# Patient Record
Sex: Male | Born: 1950 | Race: White | Hispanic: No | Marital: Married | Smoking: Never smoker
Health system: Southern US, Community
[De-identification: ages and names within clinical notes are randomized; demographics above are authoritative.]

## PROBLEM LIST (undated history)

## (undated) DIAGNOSIS — M5020 Other cervical disc displacement, unspecified cervical region: Secondary | ICD-10-CM

## (undated) DIAGNOSIS — R269 Unspecified abnormalities of gait and mobility: Secondary | ICD-10-CM

## (undated) DIAGNOSIS — F32A Depression, unspecified: Secondary | ICD-10-CM

## (undated) DIAGNOSIS — M5126 Other intervertebral disc displacement, lumbar region: Secondary | ICD-10-CM

## (undated) DIAGNOSIS — R519 Headache, unspecified: Secondary | ICD-10-CM

## (undated) DIAGNOSIS — E162 Hypoglycemia, unspecified: Secondary | ICD-10-CM

## (undated) DIAGNOSIS — Z87442 Personal history of urinary calculi: Secondary | ICD-10-CM

## (undated) DIAGNOSIS — L309 Dermatitis, unspecified: Secondary | ICD-10-CM

## (undated) DIAGNOSIS — M1990 Unspecified osteoarthritis, unspecified site: Secondary | ICD-10-CM

## (undated) DIAGNOSIS — T148XXA Other injury of unspecified body region, initial encounter: Secondary | ICD-10-CM

## (undated) DIAGNOSIS — K219 Gastro-esophageal reflux disease without esophagitis: Secondary | ICD-10-CM

## (undated) DIAGNOSIS — F329 Major depressive disorder, single episode, unspecified: Secondary | ICD-10-CM

## (undated) DIAGNOSIS — E7800 Pure hypercholesterolemia, unspecified: Secondary | ICD-10-CM

## (undated) HISTORY — DX: Pure hypercholesterolemia, unspecified: E78.00

## (undated) HISTORY — DX: Dermatitis, unspecified: L30.9

## (undated) HISTORY — DX: Unspecified abnormalities of gait and mobility: R26.9

## (undated) HISTORY — PX: EXCISION NEUROMA: SHX6350

## (undated) HISTORY — PX: OTHER SURGICAL HISTORY: SHX169

## (undated) HISTORY — DX: Headache, unspecified: R51.9

## (undated) HISTORY — PX: CATARACT EXTRACTION: SUR2

## (undated) HISTORY — DX: Other injury of unspecified body region, initial encounter: T14.8XXA

## (undated) HISTORY — DX: Hypoglycemia, unspecified: E16.2

---
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
## 2002-05-05 ENCOUNTER — Ambulatory Visit (HOSPITAL_COMMUNITY): Admission: RE | Admit: 2002-05-05 | Discharge: 2002-05-05 | Payer: Self-pay | Admitting: Emergency Medicine

## 2002-05-05 ENCOUNTER — Encounter: Payer: Self-pay | Admitting: Emergency Medicine

## 2003-08-24 ENCOUNTER — Ambulatory Visit (HOSPITAL_COMMUNITY): Admission: RE | Admit: 2003-08-24 | Discharge: 2003-08-24 | Payer: Self-pay | Admitting: Neurosurgery

## 2003-08-24 ENCOUNTER — Encounter: Payer: Self-pay | Admitting: Neurosurgery

## 2010-12-03 ENCOUNTER — Encounter: Payer: Self-pay | Admitting: Neurosurgery

## 2011-08-13 DIAGNOSIS — Z87442 Personal history of urinary calculi: Secondary | ICD-10-CM

## 2011-08-13 HISTORY — DX: Personal history of urinary calculi: Z87.442

## 2013-05-22 ENCOUNTER — Other Ambulatory Visit: Payer: Self-pay | Admitting: Urology

## 2013-05-22 DIAGNOSIS — N200 Calculus of kidney: Secondary | ICD-10-CM

## 2013-05-23 ENCOUNTER — Other Ambulatory Visit: Payer: Self-pay | Admitting: Radiology

## 2013-07-09 ENCOUNTER — Encounter (HOSPITAL_COMMUNITY): Payer: Self-pay | Admitting: Pharmacy Technician

## 2013-07-10 ENCOUNTER — Encounter (HOSPITAL_COMMUNITY): Payer: Self-pay | Admitting: *Deleted

## 2013-07-21 ENCOUNTER — Other Ambulatory Visit: Payer: Self-pay | Admitting: Radiology

## 2013-07-21 MED ORDER — SODIUM CHLORIDE 0.9 % IV SOLN
2.0000 g | INTRAVENOUS | Status: AC
  Administered 2013-07-22: 2 g via INTRAVENOUS
  Filled 2013-07-21: qty 2000

## 2013-07-22 ENCOUNTER — Encounter (HOSPITAL_COMMUNITY): Payer: Self-pay | Admitting: Anesthesiology

## 2013-07-22 ENCOUNTER — Encounter (HOSPITAL_COMMUNITY): Payer: Self-pay

## 2013-07-22 ENCOUNTER — Ambulatory Visit (HOSPITAL_COMMUNITY): Payer: BC Managed Care – PPO

## 2013-07-22 ENCOUNTER — Ambulatory Visit (HOSPITAL_COMMUNITY)
Admission: RE | Admit: 2013-07-22 | Discharge: 2013-07-22 | Disposition: A | Discharge status: Home / Self Care | Payer: BC Managed Care – PPO | Source: Ambulatory Visit | Attending: Urology | Admitting: Urology

## 2013-07-22 ENCOUNTER — Ambulatory Visit (HOSPITAL_COMMUNITY): Payer: BC Managed Care – PPO | Admitting: Anesthesiology

## 2013-07-22 ENCOUNTER — Ambulatory Visit (HOSPITAL_COMMUNITY)
Admission: RE | Admit: 2013-07-22 | Discharge: 2013-07-23 | Disposition: A | Discharge status: Home / Self Care | Payer: BC Managed Care – PPO | Source: Ambulatory Visit | Attending: Urology | Admitting: Urology

## 2013-07-22 ENCOUNTER — Other Ambulatory Visit: Payer: Self-pay | Admitting: Urology

## 2013-07-22 ENCOUNTER — Encounter (HOSPITAL_COMMUNITY)
Admission: RE | Disposition: A | Discharge status: Home / Self Care | Payer: Self-pay | Source: Ambulatory Visit | Attending: Urology

## 2013-07-22 DIAGNOSIS — N200 Calculus of kidney: Secondary | ICD-10-CM

## 2013-07-22 DIAGNOSIS — K219 Gastro-esophageal reflux disease without esophagitis: Secondary | ICD-10-CM | POA: Insufficient documentation

## 2013-07-22 DIAGNOSIS — N135 Crossing vessel and stricture of ureter without hydronephrosis: Secondary | ICD-10-CM | POA: Insufficient documentation

## 2013-07-22 HISTORY — DX: Major depressive disorder, single episode, unspecified: F32.9

## 2013-07-22 HISTORY — DX: Other cervical disc displacement, unspecified cervical region: M50.20

## 2013-07-22 HISTORY — DX: Other intervertebral disc displacement, lumbar region: M51.26

## 2013-07-22 HISTORY — DX: Personal history of urinary calculi: Z87.442

## 2013-07-22 HISTORY — DX: Gastro-esophageal reflux disease without esophagitis: K21.9

## 2013-07-22 HISTORY — DX: Unspecified osteoarthritis, unspecified site: M19.90

## 2013-07-22 HISTORY — DX: Depression, unspecified: F32.A

## 2013-07-22 HISTORY — PX: NEPHROLITHOTOMY: SHX5134

## 2013-07-22 LAB — CBC WITH DIFFERENTIAL/PLATELET
Basophils Absolute: 0 10*3/uL (ref 0.0–0.1)
Basophils Relative: 1 % (ref 0–1)
Eosinophils Absolute: 0.3 10*3/uL (ref 0.0–0.7)
Eosinophils Relative: 4 % (ref 0–5)
HCT: 43.6 % (ref 39.0–52.0)
Hemoglobin: 15.2 g/dL (ref 13.0–17.0)
Lymphocytes Relative: 43 % (ref 12–46)
Lymphs Abs: 3 10*3/uL (ref 0.7–4.0)
MCH: 32.3 pg (ref 26.0–34.0)
MCHC: 34.9 g/dL (ref 30.0–36.0)
MCV: 92.6 fL (ref 78.0–100.0)
Monocytes Absolute: 0.7 10*3/uL (ref 0.1–1.0)
Monocytes Relative: 10 % (ref 3–12)
Neutro Abs: 2.8 10*3/uL (ref 1.7–7.7)
Neutrophils Relative %: 42 % — ABNORMAL LOW (ref 43–77)
Platelets: 213 10*3/uL (ref 150–400)
RBC: 4.71 MIL/uL (ref 4.22–5.81)
RDW: 12.2 % (ref 11.5–15.5)
WBC: 6.8 10*3/uL (ref 4.0–10.5)

## 2013-07-22 LAB — BASIC METABOLIC PANEL
BUN: 21 mg/dL (ref 6–23)
CO2: 30 mEq/L (ref 19–32)
Calcium: 9.3 mg/dL (ref 8.4–10.5)
Chloride: 104 mEq/L (ref 96–112)
Creatinine, Ser: 0.84 mg/dL (ref 0.50–1.35)
GFR calc Af Amer: >90 mL/min (ref >90)
GFR calc non Af Amer: >90 mL/min (ref >90)
Glucose, Bld: 88 mg/dL (ref 70–99)
Potassium: 3.8 mEq/L (ref 3.5–5.1)
Sodium: 138 mEq/L (ref 135–145)

## 2013-07-22 LAB — ABO/RH: ABO/RH(D): O POS

## 2013-07-22 LAB — TYPE AND SCREEN
ABO/RH(D): O POS
Antibody Screen: NEGATIVE

## 2013-07-22 LAB — APTT: aPTT: 25 seconds (ref 24–37)

## 2013-07-22 SURGERY — NEPHROLITHOTOMY PERCUTANEOUS
Anesthesia: General | Laterality: Left | Wound class: Clean

## 2013-07-22 MED ORDER — CIPROFLOXACIN IN D5W 400 MG/200ML IV SOLN
INTRAVENOUS | Status: AC
  Filled 2013-07-22: qty 200

## 2013-07-22 MED ORDER — DEXAMETHASONE SODIUM PHOSPHATE 10 MG/ML IJ SOLN
INTRAMUSCULAR | Status: DC | PRN
  Administered 2013-07-22: 10 mg via INTRAVENOUS

## 2013-07-22 MED ORDER — MIDAZOLAM HCL 2 MG/2ML IJ SOLN
INTRAMUSCULAR | Status: AC
  Filled 2013-07-22: qty 4

## 2013-07-22 MED ORDER — PROPOFOL 10 MG/ML IV BOLUS
INTRAVENOUS | Status: DC | PRN
  Administered 2013-07-22: 20 mg via INTRAVENOUS
  Administered 2013-07-22: 10 mg via INTRAVENOUS
  Administered 2013-07-22: 160 mg via INTRAVENOUS

## 2013-07-22 MED ORDER — GENTAMICIN SULFATE 40 MG/ML IJ SOLN
1.5000 mg/kg | INTRAVENOUS | Status: AC
  Administered 2013-07-22: 120 mg via INTRAVENOUS
  Filled 2013-07-22: qty 2.94

## 2013-07-22 MED ORDER — GLYCOPYRROLATE 0.2 MG/ML IJ SOLN
INTRAMUSCULAR | Status: DC | PRN
  Administered 2013-07-22: 0.4 mg via INTRAVENOUS
  Administered 2013-07-22: 0.2 mg via INTRAVENOUS

## 2013-07-22 MED ORDER — SODIUM CHLORIDE 0.9 % IV SOLN
INTRAVENOUS | Status: DC
  Administered 2013-07-22: 08:00:00 via INTRAVENOUS

## 2013-07-22 MED ORDER — LIDOCAINE HCL 1 % IJ SOLN
INTRAMUSCULAR | Status: AC
  Filled 2013-07-22: qty 20

## 2013-07-22 MED ORDER — EPHEDRINE SULFATE 50 MG/ML IJ SOLN
INTRAMUSCULAR | Status: DC | PRN
  Administered 2013-07-22: 10 mg via INTRAVENOUS

## 2013-07-22 MED ORDER — SODIUM CHLORIDE 0.9 % IR SOLN
Status: DC | PRN
  Administered 2013-07-22: 15000 mL

## 2013-07-22 MED ORDER — MORPHINE SULFATE 2 MG/ML IJ SOLN
INTRAMUSCULAR | Status: AC
  Filled 2013-07-22: qty 1

## 2013-07-22 MED ORDER — HYDROMORPHONE HCL PF 1 MG/ML IJ SOLN
INTRAMUSCULAR | Status: DC | PRN
  Administered 2013-07-22 (×4): 0.5 mg via INTRAVENOUS

## 2013-07-22 MED ORDER — SENNOSIDES-DOCUSATE SODIUM 8.6-50 MG PO TABS
1.0000 | ORAL_TABLET | Freq: Two times a day (BID) | ORAL | Status: DC
  Administered 2013-07-22 – 2013-07-23 (×2): 1 via ORAL
  Filled 2013-07-22 (×4): qty 1

## 2013-07-22 MED ORDER — LIDOCAINE HCL (CARDIAC) 20 MG/ML IV SOLN
INTRAVENOUS | Status: DC | PRN
  Administered 2013-07-22: 30 mg via INTRAVENOUS

## 2013-07-22 MED ORDER — MIDAZOLAM HCL 5 MG/5ML IJ SOLN
INTRAMUSCULAR | Status: DC | PRN
  Administered 2013-07-22: 1 mg via INTRAVENOUS

## 2013-07-22 MED ORDER — NEOSTIGMINE METHYLSULFATE 1 MG/ML IJ SOLN
INTRAMUSCULAR | Status: DC | PRN
  Administered 2013-07-22: 3 mg via INTRAVENOUS

## 2013-07-22 MED ORDER — EPHEDRINE SULFATE 50 MG/ML IJ SOLN: INTRAMUSCULAR | Status: DC | PRN

## 2013-07-22 MED ORDER — FLUTICASONE PROPIONATE 50 MCG/ACT NA SUSP
1.0000 | Freq: Every day | NASAL | Status: DC
  Administered 2013-07-23: 1 via NASAL
  Filled 2013-07-22: qty 16

## 2013-07-22 MED ORDER — IOHEXOL 300 MG/ML  SOLN
INTRAMUSCULAR | Status: DC | PRN
  Administered 2013-07-22: 32 mL

## 2013-07-22 MED ORDER — MORPHINE SULFATE 2 MG/ML IJ SOLN
2.0000 mg | INTRAMUSCULAR | Status: DC | PRN
  Administered 2013-07-22 (×2): 2 mg via INTRAVENOUS
  Filled 2013-07-22: qty 2

## 2013-07-22 MED ORDER — FENTANYL CITRATE 0.05 MG/ML IJ SOLN
INTRAMUSCULAR | Status: AC
  Filled 2013-07-22: qty 4

## 2013-07-22 MED ORDER — LACTATED RINGERS IV SOLN
INTRAVENOUS | Status: DC | PRN
  Administered 2013-07-22 (×2): via INTRAVENOUS

## 2013-07-22 MED ORDER — CIPROFLOXACIN IN D5W 400 MG/200ML IV SOLN: 400.0000 mg | Freq: Once | INTRAVENOUS | Status: DC

## 2013-07-22 MED ORDER — ONDANSETRON HCL 4 MG/2ML IJ SOLN
INTRAMUSCULAR | Status: DC | PRN
  Administered 2013-07-22 (×2): 2 mg via INTRAVENOUS

## 2013-07-22 MED ORDER — OXYCODONE HCL 5 MG PO TABS
5.0000 mg | ORAL_TABLET | ORAL | Status: DC | PRN
  Administered 2013-07-23: 10 mg via ORAL
  Filled 2013-07-22: qty 2

## 2013-07-22 MED ORDER — 0.9 % SODIUM CHLORIDE (POUR BTL) OPTIME
TOPICAL | Status: DC | PRN
  Administered 2013-07-22: 1000 mL

## 2013-07-22 MED ORDER — OXYBUTYNIN CHLORIDE 5 MG PO TABS
5.0000 mg | ORAL_TABLET | Freq: Three times a day (TID) | ORAL | Status: DC | PRN
  Filled 2013-07-22: qty 1

## 2013-07-22 MED ORDER — ONDANSETRON HCL 4 MG/2ML IJ SOLN
4.0000 mg | INTRAMUSCULAR | Status: DC | PRN
  Administered 2013-07-22: 4 mg via INTRAVENOUS
  Filled 2013-07-22: qty 2

## 2013-07-22 MED ORDER — CISATRACURIUM BESYLATE (PF) 10 MG/5ML IV SOLN
INTRAVENOUS | Status: DC | PRN
  Administered 2013-07-22: 3 mg via INTRAVENOUS
  Administered 2013-07-22: 4 mg via INTRAVENOUS

## 2013-07-22 MED ORDER — GENTAMICIN SULFATE 40 MG/ML IJ SOLN
5.0000 mg/kg | Freq: Once | INTRAVENOUS | Status: AC
  Administered 2013-07-22: 390 mg via INTRAVENOUS
  Filled 2013-07-22: qty 9.81

## 2013-07-22 MED ORDER — SODIUM CHLORIDE 0.9 % IV SOLN
INTRAVENOUS | Status: DC
  Administered 2013-07-22 – 2013-07-23 (×3): via INTRAVENOUS

## 2013-07-22 MED ORDER — KETAMINE HCL 10 MG/ML IJ SOLN
INTRAMUSCULAR | Status: DC | PRN
  Administered 2013-07-22: 20 mg via INTRAVENOUS

## 2013-07-22 MED ORDER — SUCCINYLCHOLINE CHLORIDE 20 MG/ML IJ SOLN
INTRAMUSCULAR | Status: DC | PRN
  Administered 2013-07-22: 100 mg via INTRAVENOUS

## 2013-07-22 MED ORDER — IOHEXOL 300 MG/ML  SOLN
20.0000 mL | Freq: Once | INTRAMUSCULAR | Status: AC | PRN
  Administered 2013-07-22: 40 mL

## 2013-07-22 MED ORDER — MIDAZOLAM HCL 2 MG/2ML IJ SOLN
INTRAMUSCULAR | Status: AC | PRN
  Administered 2013-07-22: 2 mg via INTRAVENOUS
  Administered 2013-07-22 (×2): 1 mg via INTRAVENOUS

## 2013-07-22 MED ORDER — SODIUM CHLORIDE 0.9 % IV SOLN
1.0000 g | Freq: Four times a day (QID) | INTRAVENOUS | Status: AC
  Administered 2013-07-22 – 2013-07-23 (×3): 1 g via INTRAVENOUS
  Filled 2013-07-22 (×3): qty 1000

## 2013-07-22 MED ORDER — PROMETHAZINE HCL 25 MG/ML IJ SOLN: 6.2500 mg | INTRAMUSCULAR | Status: DC | PRN

## 2013-07-22 MED ORDER — FENTANYL CITRATE 0.05 MG/ML IJ SOLN
INTRAMUSCULAR | Status: AC | PRN
  Administered 2013-07-22: 100 ug via INTRAVENOUS
  Administered 2013-07-22 (×2): 50 ug via INTRAVENOUS

## 2013-07-22 MED ORDER — HYDROMORPHONE HCL PF 1 MG/ML IJ SOLN: 0.2500 mg | INTRAMUSCULAR | Status: DC | PRN

## 2013-07-22 MED ORDER — SODIUM CHLORIDE 0.9 % IV SOLN
INTRAVENOUS | Status: DC | PRN
  Administered 2013-07-22: 12:00:00 via INTRAVENOUS

## 2013-07-22 SURGICAL SUPPLY — 61 items
BAG URINE DRAINAGE (UROLOGICAL SUPPLIES) ×2 IMPLANT
BAG URO CATCHER STRL LF (DRAPE) IMPLANT
BANDAGE GAUZE ELAST BULKY 4 IN (GAUZE/BANDAGES/DRESSINGS) ×2 IMPLANT
BASKET STONE NITINOL 3FRX115MB (UROLOGICAL SUPPLIES) IMPLANT
BASKET ZERO TIP NITINOL 2.4FR (BASKET) ×2 IMPLANT
BENZOIN TINCTURE PRP APPL 2/3 (GAUZE/BANDAGES/DRESSINGS) ×2 IMPLANT
CATH COUNCIL 22FR (CATHETERS) ×2 IMPLANT
CATH FOLEY 2W COUNCIL 20FR 5CC (CATHETERS) IMPLANT
CATH FOLEY 2WAY SLVR  5CC 18FR (CATHETERS) ×1
CATH FOLEY 2WAY SLVR 5CC 18FR (CATHETERS) ×1 IMPLANT
CATH ROBINSON RED A/P 20FR (CATHETERS) IMPLANT
CATH URET 5FR 28IN OPEN ENDED (CATHETERS) ×2 IMPLANT
CATH URET DUAL LUMEN 6-10FR 50 (CATHETERS) ×2 IMPLANT
CATH X-FORCE N30 NEPHROSTOMY (TUBING) IMPLANT
CHLORAPREP W/TINT 26ML (MISCELLANEOUS) ×2 IMPLANT
CLOTH BEACON ORANGE TIMEOUT ST (SAFETY) ×2 IMPLANT
COVER SURGICAL LIGHT HANDLE (MISCELLANEOUS) ×2 IMPLANT
DRAPE C-ARM 42X120 X-RAY (DRAPES) ×2 IMPLANT
DRAPE CAMERA CLOSED 9X96 (DRAPES) ×2 IMPLANT
DRAPE LINGEMAN PERC (DRAPES) ×2 IMPLANT
DRAPE SURG IRRIG POUCH 19X23 (DRAPES) ×2 IMPLANT
DRAPE UTILITY XL STRL (DRAPES) ×2 IMPLANT
DRSG PAD ABDOMINAL 8X10 ST (GAUZE/BANDAGES/DRESSINGS) ×2 IMPLANT
GLOVE BIOGEL M 7.0 STRL (GLOVE) IMPLANT
GLOVE BIOGEL PI IND STRL 7.5 (GLOVE) ×1 IMPLANT
GLOVE BIOGEL PI INDICATOR 7.5 (GLOVE) ×1
GLOVE ECLIPSE 7.0 STRL STRAW (GLOVE) ×2 IMPLANT
GLOVE SURG SS PI 8.0 STRL IVOR (GLOVE) IMPLANT
GOWN PREVENTION PLUS XLARGE (GOWN DISPOSABLE) ×2 IMPLANT
GOWN STRL REIN XL XLG (GOWN DISPOSABLE) ×4 IMPLANT
GUIDEWIRE AMPLAZ .035X145 (WIRE) ×4 IMPLANT
GUIDEWIRE STR DUAL SENSOR (WIRE) ×2 IMPLANT
KIT BASIN OR (CUSTOM PROCEDURE TRAY) ×2 IMPLANT
LASER FIBER DISP (UROLOGICAL SUPPLIES) IMPLANT
LASER FIBER DISP 1000U (UROLOGICAL SUPPLIES) IMPLANT
MANIFOLD NEPTUNE II (INSTRUMENTS) ×2 IMPLANT
MARKER SKIN DUAL TIP RULER LAB (MISCELLANEOUS) IMPLANT
PACK BASIC VI WITH GOWN DISP (CUSTOM PROCEDURE TRAY) ×2 IMPLANT
PACK CYSTO (CUSTOM PROCEDURE TRAY) IMPLANT
PROBE LITHOCLAST ULTRA 3.8X403 (UROLOGICAL SUPPLIES) ×2 IMPLANT
PROBE PNEUMATIC 1.0MMX570MM (UROLOGICAL SUPPLIES) IMPLANT
SCRUB PCMX 4 OZ (MISCELLANEOUS) IMPLANT
SET IRRIG Y TYPE TUR BLADDER L (SET/KITS/TRAYS/PACK) ×2 IMPLANT
SET WARMING FLUID IRRIGATION (MISCELLANEOUS) IMPLANT
SHEATH PEELAWAY SET 9 (SHEATH) ×2 IMPLANT
SLEEVE SURGEON STRL (DRAPES) ×2 IMPLANT
SPONGE LAP 4X18 X RAY DECT (DISPOSABLE) ×2 IMPLANT
STENT CONTOUR 6FRX28X.038 (STENTS) ×2 IMPLANT
STENT ENDOURETEROTOMY 7-14 26C (STENTS) IMPLANT
STENT PERCUFLEX 4.8FRX28 (STENTS) ×2 IMPLANT
STONE CATCHER W/TUBE ADAPTER (UROLOGICAL SUPPLIES) ×2 IMPLANT
SUT SILK 2 0 30  PSL (SUTURE) ×1
SUT SILK 2 0 30 PSL (SUTURE) ×1 IMPLANT
SYR 20CC LL (SYRINGE) ×4 IMPLANT
SYRINGE 10CC LL (SYRINGE) ×2 IMPLANT
SYRINGE IRR TOOMEY STRL 70CC (SYRINGE) ×2 IMPLANT
TAPE CLOTH SURG 4X10 WHT LF (GAUZE/BANDAGES/DRESSINGS) ×2 IMPLANT
TOWEL OR NON WOVEN STRL DISP B (DISPOSABLE) ×2 IMPLANT
TRAY FOLEY BAG SILVER LF 16FR (CATHETERS) ×2 IMPLANT
TRAY FOLEY CATH 14FRSI W/METER (CATHETERS) IMPLANT
TUBING CONNECTING 10 (TUBING) ×4 IMPLANT

## 2013-07-22 NOTE — H&P (Signed)
Urology History and Physical Exam  CC: Left nephrolithiasis.  HPI:  62 year old male presents today for left nephrolithiasis.  This is a chronic problem.  It is been present since at least 2012.  This was discovered during workup for hematuria.  He returned this summer to discuss varicocele.  Due to ongoing microscopic hematuria he had a KUB to evaluate his stone.  This revealed the stone was much larger than previously seen in 2012.  Repeat CT scan 07/09/13 revealed that the stone measured 1.8 x 1.6 cm in size.  It was located in the lower portion of the left renal pelvis.  It is also noted to be punctate stone in the left lower pole.  The left ureter was mildly dilated but no stones were seen in the abdominal ureter.  CT scan did not include the pelvis.  It is possible he could have a more distal stone on the left side.  This is not symptomatic.  It is not associated with flank pain.  It is not associated with recurrent infections.  Urine culture from 07/09/13 was negative for growth.  We discussed management options and he elected to proceed with left-sided percutaneous nephrostolithotomy.  We have discussed the risks, benefits, alternatives, and likely achieving goals. We have discussed the risk of need for blood transfusion along with the risks, benefits, and side effects; he is OK with a transfusion if needed.  He had percutaneous access of the left lower pole kidney by the interventional radiologist today.  I have reviewed the films of the access point. It appears that the inferior posterior calyx of the kidney is access. This patient has significant narrowing at the left ureteropelvic junction. This could be due to the stone, but it appears that the ureter itself was narrowed and this could account for the rapid increase in size in the patient's stone over a short period of time. This information was shared with the patient and his wife prior to the surgery.   PMH: Past Medical History  Diagnosis  Date  . Depression     MILD, NO CURRENT MEDS FOR  . History of kidney stones OCT 2012  . GERD (gastroesophageal reflux disease)   . Arthritis   . DJD (degenerative joint disease)   . 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 Cervical disc herniation     C 8    PSH: Past Surgical History  Procedure Laterality Date  . Cervical disc fused  15 YRS AGO    C 6 TO C 8  . Lasix eye surgery Bilateral YRS AGO  . Wrist ganglion cyst removed Right 1969    Allergies: Allergies  Allergen Reactions  . Levaquin [Levofloxacin In D5w] Other (See Comments)    INTENSE ASCHILLES TENON PAIN    Medications: No prescriptions prior to admission     Social History: History   Social History  . Marital Status: Married    Spouse Name: N/A    Number of Children: N/A  . Years of Education: N/A   Occupational History  . Not on file.   Social History Main Topics  . Smoking status: Never Smoker   . Smokeless tobacco: Never Used  . Alcohol Use: 4.2 oz/week    7 Glasses of wine per week     Comment: GLASS WINE Q NIGHT  . Drug Use: No  . Sexual Activity: Not on file   Other Topics Concern  . Not on file   Social  History Narrative  . No narrative on file    Family History: History reviewed. No pertinent family history.  Review of Systems: Positive: None. Negative: Fever, chest pain, or SOB.  A further 10 point review of systems was negative except what is listed in the HPI.  Physical Exam: General: No acute distress.  Awake. Head:  Normocephalic.  Atraumatic. ENT:  EOMI.  Mucous membranes moist Neck:  Supple.  No lymphadenopathy. CV:  S1 present. S2 present. Regular rate. Pulmonary: Equal effort bilaterally.  Clear to auscultation bilaterally. Abdomen: Soft.  Non- tender to palpation. Skin:  Normal turgor.  No visible rash. Extremity: No gross deformity of bilateral upper extremities.  No gross deformity of    bilateral lower extremities. Neurologic: Alert. Appropriate  mood.    Studies:  No results found for this basename: HGB, WBC, PLT,  in the last 72 hours  No results found for this basename: NA, K, CL, CO2, BUN, CREATININE, CALCIUM, MAGNESIUM, GFRNONAA, GFRAA,  in the last 72 hours   No results found for this basename: PT, INR, APTT,  in the last 72 hours   No components found with this basename: ABG,     Assessment:  Left nephrolithiasis  Plan: Type and screen for blood as a precaution.  We will plan to proceed with left percutaneous nephrostolithotomy.

## 2013-07-22 NOTE — Transfer of Care (Signed)
Immediate Anesthesia Transfer of Care Note  Patient: [name] [facility]  Procedure(s) Performed: Procedure(s) (LRB): NEPHROLITHOTOMY PERCUTANEOUS (Left)  Patient Location: PACU  Anesthesia Type: General  Level of Consciousness: sedated, patient cooperative and responds to stimulaton  Airway & Oxygen Therapy: Patient Spontanous Breathing and Patient connected to face mask oxgen  Post-op Assessment: Report given to PACU RN and Post -op Vital signs reviewed and stable  Post vital signs: Reviewed and stable  Complications: No apparent anesthesia complications

## 2013-07-22 NOTE — Progress Notes (Signed)
Post-op check  Patient is doing well. Pain is well-controlled.  I explained the surgical findings and the results of the surgery.  He remains fairly sedated.  Filed Vitals:   07/22/13 1700  BP:   Pulse: 64  Temp:   Resp: 14   Hgb 14.4  Gen: NAD CV: RRR Chest: CTA-B Abd: soft, NTTP GU: Left flank without ecchymosis, nephrostomy site dressed, Foley & nephrostomy tube draining pink to clear red.  A/P: Left nephrolithiasis and Left UPJ stenosis. -Left PCNL today w/ left indwelling ureter stent.  -Observation for pain control.  -D/c foley in morning. Cap neph tube in morning. KUB in the morning.

## 2013-07-22 NOTE — Anesthesia Postprocedure Evaluation (Signed)
  Anesthesia Post-op Note  Patient: [name] [facility]  Procedure(s) Performed: Procedure(s) (LRB): NEPHROLITHOTOMY PERCUTANEOUS (Left)  Patient Location: PACU  Anesthesia Type: General  Level of Consciousness: awake and alert   Airway and Oxygen Therapy: Patient Spontanous Breathing  Post-op Pain: mild  Post-op Assessment: Post-op Vital signs reviewed, Patient's Cardiovascular Status Stable, Respiratory Function Stable, Patent Airway and No signs of Nausea or vomiting  Last Vitals:  Filed Vitals:   07/22/13 1700  BP:   Pulse: 64  Temp:   Resp: 14    Post-op Vital Signs: stable   Complications: No apparent anesthesia complications

## 2013-07-22 NOTE — H&P (Signed)
Chief Complaint: "I am here for a left kidney tube to be placed." Referring Physician: Dr. Margarita Grizzle HPI: [name] is an 62 y.o. male who has seen Dr. Margarita Grizzle in office 07/09/13 for kidney stones, previous CT 08/2011 done for gross hematuria revealed LLP 4mm, RLP punctate. Patient c/o mid back pain with varicole scrotal pain. The patient denies any gross hematuria, chest pain or shortness of breath currently. He denies any recent illness, fever or chills. He denies any flank pain.   Past Medical History:  Past Medical History  Diagnosis Date  . Depression     MILD, NO CURRENT MEDS FOR  . History of kidney stones OCT 2012  . GERD (gastroesophageal reflux disease)   . Arthritis   . DJD (degenerative joint disease)   . 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 Cervical disc herniation     C 8    Past Surgical History:  Past Surgical History  Procedure Laterality Date  . Cervical disc fused  15 YRS AGO    C 6 TO C 8  . Lasix eye surgery Bilateral YRS AGO  . Wrist ganglion cyst removed Right 1969    Family History: History reviewed. No pertinent family history.  Social History:  reports that he has never smoked. He has never used smokeless tobacco. He reports that he drinks about 4.2 ounces of alcohol per week. He reports that he does not use illicit drugs.  Allergies:  Allergies  Allergen Reactions  . Levaquin [Levofloxacin In D5w] Other (See Comments)    INTENSE ASCHILLES [place]ON PAIN    Medications: Flonase, Ginkgo caps, Glucosamine caps, omega 3 caps, propranolol  Please HPI for pertinent positives, otherwise complete 10 system ROS negative.  Physical Exam: BP 128/80  Pulse 75  Temp(Src) 98.5 F (36.9 C) (Oral)  Resp 18  Ht 6' (1.829 m)  Wt 173 lb (78.472 kg)  BMI 23.46 kg/m2 Body mass index is 23.46 kg/(m^2).   General Appearance:  Alert, cooperative, no distress, appears stated age  Head:  Normocephalic, without obvious abnormality, atraumatic  Lungs:    Clear to auscultation bilaterally, no w/r/r, respirations unlabored without use of accessory muscles.  Chest Wall:  No tenderness or deformity  Heart:  Regular rate and rhythm, S1, S2 normal, no murmur, rub or gallop.  Abdomen:   Soft, non-tender, non distended.  Extremities: Extremities normal, atraumatic, no cyanosis or edema  Pulses: 1+ and symmetric  Neurologic: Normal affect, no gross deficits.   Results for orders placed during the hospital encounter of 07/22/13 (from the past 48 hour(s))  APTT     Status: None   Collection Time    07/22/13  8:00 AM      Result Value Range   aPTT 25  24 - 37 seconds  BASIC METABOLIC PANEL     Status: None   Collection Time    07/22/13  8:00 AM      Result Value Range   Sodium 138  135 - 145 mEq/L   Potassium 3.8  3.5 - 5.1 mEq/L   Chloride 104  96 - 112 mEq/L   CO2 30  19 - 32 mEq/L   Glucose, Bld 88  70 - 99 mg/dL   BUN 21  6 - 23 mg/dL   Creatinine, Ser 4.13  0.50 - 1.35 mg/dL   Calcium 9.3  8.4 - 24.4 mg/dL   GFR calc non Af Amer >90  >90 mL/min   GFR  calc Af Amer >90  >90 mL/min   Comment: (NOTE)     The eGFR has been calculated using the CKD EPI equation.     This calculation has not been validated in all clinical situations.     eGFR's persistently <90 mL/min signify possible Chronic Kidney     Disease.  CBC WITH DIFFERENTIAL     Status: Abnormal   Collection Time    07/22/13  8:00 AM      Result Value Range   WBC 6.8  4.0 - 10.5 K/uL   RBC 4.71  4.22 - 5.81 MIL/uL   Hemoglobin 15.2  13.0 - 17.0 g/dL   HCT 16.1  09.6 - 04.5 %   MCV 92.6  78.0 - 100.0 fL   MCH 32.3  26.0 - 34.0 pg   MCHC 34.9  30.0 - 36.0 g/dL   RDW 40.9  81.1 - 91.4 %   Platelets 213  150 - 400 K/uL   Neutrophils Relative % 42 (*) 43 - 77 %   Neutro Abs 2.8  1.7 - 7.7 K/uL   Lymphocytes Relative 43  12 - 46 %   Lymphs Abs 3.0  0.7 - 4.0 K/uL   Monocytes Relative 10  3 - 12 %   Monocytes Absolute 0.7  0.1 - 1.0 K/uL   Eosinophils Relative 4  0 - 5 %    Eosinophils Absolute 0.3  0.0 - 0.7 K/uL   Basophils Relative 1  0 - 1 %   Basophils Absolute 0.0  0.0 - 0.1 K/uL  PROTIME-INR     Status: None   Collection Time    07/22/13  8:00 AM      Result Value Range   Prothrombin Time 12.8  11.6 - 15.2 seconds   INR 0.98  0.00 - 1.49   No results found.  Assessment/Plan Bilateral nephrolithiasis, last CT 07/09/13 at urology office. Request per Dr. Margarita Grizzle for image guided left percutaneous nephrostomy placement today prior to left nephrostolithotomy.  Labs reviewed, patient has been NPO. Gentamicin ordered.  Risks and Benefits discussed with the patient. All of the patient's questions were answered, patient is agreeable to proceed. Consent signed and in chart.    Pattricia Boss D PA-C 07/22/2013, 9:05 AM

## 2013-07-22 NOTE — Procedures (Signed)
Successful placement of left nephroureteral catheter with fluoroscopic guidance.  Catheter tip in urinary bladder.  Large stone in renal pelvis.  See Radiology report.

## 2013-07-22 NOTE — Anesthesia Preprocedure Evaluation (Addendum)
Anesthesia Evaluation  Patient identified by MRN, date of birth, ID band Patient awake    Reviewed: Allergy & Precautions, H&P , NPO status , Patient's Chart, lab work & pertinent test results  Airway Mallampati: II TM Distance: >3 FB Neck ROM: Limited    Dental no notable dental hx.    Pulmonary neg pulmonary ROS,  breath sounds clear to auscultation  Pulmonary exam normal       Cardiovascular negative cardio ROS  Rhythm:Regular Rate:Normal     Neuro/Psych PSYCHIATRIC DISORDERS Depression negative neurological ROS     GI/Hepatic Neg liver ROS, GERD-  ,  Endo/Other  negative endocrine ROS  Renal/GU negative Renal ROS  negative genitourinary   Musculoskeletal negative musculoskeletal ROS (+)   Abdominal   Peds negative pediatric ROS (+)  Hematology negative hematology ROS (+)   Anesthesia Other Findings   Reproductive/Obstetrics negative OB ROS                          Anesthesia Physical Anesthesia Plan  ASA: II  Anesthesia Plan: General   Post-op Pain Management:    Induction: Intravenous  Airway Management Planned: Oral ETT  Additional Equipment:   Intra-op Plan:   Post-operative Plan: Extubation in OR  Informed Consent: I have reviewed the patients History and Physical, chart, labs and discussed the procedure including the risks, benefits and alternatives for the proposed anesthesia with the patient or authorized representative who has indicated his/her understanding and acceptance.   Dental advisory given  Plan Discussed with: CRNA  Anesthesia Plan Comments: (Decreased ROM neck. Plan glidescope.)       Anesthesia Quick Evaluation

## 2013-07-22 NOTE — Op Note (Signed)
Urology Operative Report  Date of Procedure: 07/22/13 Surgeon: Natalia Leatherwood, MD Assistant:  None  Preoperative Diagnosis: Left nephrolithiasis. Left proximal ureter stenosis. Post-operative Diagnosis:  Same  Procedure(s): Left percutaneous nephrostolithotomy (<2 cm) Dilation of left percutaneous nephrostomy tract Lithotripsy Left antegrade ureter stent placement Left antegrade pyelogram with interpretation  Estimated blood loss: 50 cc  Specimen: Stones sent to [facility] for chemical analysis.  Drains: Left nephrostomy tube (22Fr), Foley (18Fr)  Complications: None  Findings:  Stenosis of the left ureteropelvic junction. Friable stone.  History of present illness: 62 year old male presents today for a large left kidney stone. While obtaining percutaneous access for pretest nephrostolithotomy was noted that he had stenosis of his left ureter pelvic junction. This was confirmed during surgery today. After reviewing results elected to proceed with left percutaneous nephrostolithotomy. The stone was less than 2 cm in size.   Procedure in detail: After informed consent was obtained, the patient was taken to the operating room. They were placed in the supine position. SCDs were turned on and in place. IV antibiotics were infused, and general anesthesia was induced. An 31 French Foley catheter was placed in the bladder via the urethra using sterile technique. He was then placed in a prone position on the operating room table using gel rolls to support his thorax and pillows and gel padding to support his legs which were placed in the normal anatomical position with the knee slightly bent. His neck and head as well as arms were supported and all neurovascular pressure points were padded appropriately. A sterile dressing over his left nephrostomy site was removed and his left back and flank were prepped and draped in the usual sterile fashion.  A timeout was performed in which the correct  patient, surgical site, and procedure were identified and agreed upon by the team.  I then placed a superstiff guidewire down the existing nephrostomy tube and into the bladder under fluoroscopy. The nephroureteral tube was then removed and I made an incision in the skin around the wire which was approximately 1.5 cm in size. I then made a nick in the fascia with a 15 blade scalpel. After this I placed the dual-lumen access sheath over the existing wire. I injected contrast to obtain antegrade nephrostograms. There was a large amount of extravasation initially and I believe it's because the lower calyx was skived upon access. Once I had adequate antegrade pyelogram to help guide dilation I advanced the dual-lumen access sheath to the proximal ureter. I placed a second superstiff wire down the ureter and into the bladder under fluoroscopy. The access sheath was removed and one wire was secured as the safety wire while the other was used as a working wire. I then advanced the dilating balloon under fluoroscopy into the renal pelvis and dilated slowly increasing up to 12 atmospheres under fluoroscopy, and this was held for 3 minutes. The sheath was then advanced over the dilating balloon and the balloon was deflated. The rigid nephroscope was then advanced to the sheath where I encountered perinephric fat and the capsule of the kidney. I then placed the dual-lumen access sheath and again shot an antegrade pyelogram to confirm placement in the pelvis to avoid a medial perforation. Once this was done the dilating balloon was traced back over the wire an additional dilation was performed at 12 atmospheres for 3 minutes. The access sheath was then advanced further over the balloon and the balloon was deflated and removed. The working wire was then secured to  the drape as well. The rigid nephroscope was then advanced and the stone was immediately visible. There was no injury to the medial portion of the pelvis. The  Ultratome was used to perform lithotripsy until all fragments were removed. There were some smaller fragments that were the size of the access wires. I then used a flexible nephroscope and the digital ureter scope to explore the remaining kidney. There were multiple Randall's plaques noted. There was one smaller fragment that I could grasp a 0 tip Nitinol basket and removed.  I attempted to perform an antegrade ureteroscopy, but I could not advance the digital scope beyond the ureteropelvic junction due to narrowing. I did take a picture of this. I did not force this as I was concerned this could cause damage to his narrow area. I elected to leave an indwelling ureter stent which I elected to place in an antegrade fashion. I removed the wire and outside of the access sheath in place the dual-lumen access sheath to place an additional wire through the sheath giving me to wires and the sheath. One wire was secured as a safety wire while the other was used to place the stent. I first loaded the rigid nephroscope over the wire and placed a 5 French ureter catheter down into the bladder under fluoroscopy to measure the length of the ureter. This measured longer than 25 cm and therefore I elected to place a 4.8 x 28 double-J stent. The 5 French ureter catheter with ease but before point a stent would not advance easily. The fourth 20 stent was then removed and I elected to place a 6 [place] by 28 cm double-J stent in antegrade fashion as I felt a larger stent may be easier for me to place. This was able to be placed with mild difficulty due to the stenosis of the ureteropelvic junction. There was good curl in the bladder on fluoroscopy and a curl in the renal pelvis under fluoroscopy and direct visualization. I then placed a 22 French council-tip cath over the remaining access wire through the sheath. This was placed under fluoroscopy and I inflated 3 cc of sterile water into the balloon. I then injected contrast through  this 20 French catheter and found that the tip was well positioned within the collecting system. I then removed the sheath and there was no return of bleeding. I used a silk suture to suture the nephrostomy tube into place. I then removed the sheath and obtain one last fluoroscopic image which showed good position of the nephrostomy tube and a ureter stent. This completed the procedure. His nephrostomy site was bandaged with sterile dressing and connected to closed drainage. He was placed back in a supine position, anesthesia was reversed, and he was taken to the PACU in stable condition.  He will be kept overnight for pain control and observation.  All counts were correct at the end of the case.

## 2013-07-22 NOTE — Progress Notes (Signed)
ANTIBIOTIC CONSULT NOTE - INITIAL  Pharmacy Consult for:  Gentamicin Indication:  Post-op infection prophylaxis, synergy with ampicillin  Allergies  Allergen Reactions  . Levaquin [Levofloxacin In D5w] Other (See Comments)    INTENSE ASCHILLES TENON PAIN    Patient Measurements: Height: 6' (182.9 cm) Weight: 173 lb (78.472 kg) IBW/kg (Calculated) : 77.6  Vital Signs: Temp: 97.4 F (36.3 C) (09/10 1739) Temp src: Oral (09/10 0737) BP: 135/76 mmHg (09/10 1739) Pulse Rate: 68 (09/10 1739)  Intake/Output from this shift: Total I/O In: 1750 [I.V.:1750] Out: 480 [Urine:430; Blood:50]  Labs:  Recent Labs  07/22/13 0800 07/22/13 1624  WBC 6.8  --   HGB 15.2 14.4  PLT 213  --   CREATININE 0.84  --    Estimated Creatinine Clearance: 101.4 ml/min (by C-G formula based on Cr of 0.84).    Medical History: Past Medical History  Diagnosis Date  . Depression     MILD, NO CURRENT MEDS FOR  . History of kidney stones OCT 2012  . GERD (gastroesophageal reflux disease)   . Arthritis   . DJD (degenerative joint disease)   . 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 Cervical disc herniation     C 8    Medications:  Scheduled:  . ampicillin (OMNIPEN) IV  1 g Intravenous Q6H  . fluticasone  1 spray Each Nare Daily  . senna-docusate  1 tablet Oral BID   Assessment:  Asked to assist with Gentamicin therapy for this 62 year-old male following left percutaneous nephrostolithotomy today, to provide synergy with Ampicillin.  This patient meets the qualifications for extended-interval dosing.  A single dose of 5 mg/kg is expected to provide adequate drug levels for a 24-hour period.  A single dose of 120 mg (1.5 mg/kg) was given earlier today at 12:45 pm.  Goal of Therapy:  Prevention of post-op infection  Plan:  Give Gentamicin 390 mg IV as a single dose at 10:00 pm tonight.  Polo Riley R.Ph. 07/22/2013 7:03 PM

## 2013-07-23 ENCOUNTER — Encounter (HOSPITAL_COMMUNITY): Payer: Self-pay | Admitting: Urology

## 2013-07-23 ENCOUNTER — Ambulatory Visit (HOSPITAL_COMMUNITY): Payer: BC Managed Care – PPO

## 2013-07-23 LAB — BASIC METABOLIC PANEL
BUN: 16 mg/dL (ref 6–23)
CO2: 23 mEq/L (ref 19–32)
Chloride: 106 mEq/L (ref 96–112)
Creatinine, Ser: 0.73 mg/dL (ref 0.50–1.35)
GFR calc Af Amer: >90 mL/min (ref >90)
Potassium: 4.2 mEq/L (ref 3.5–5.1)

## 2013-07-23 LAB — HEMOGLOBIN AND HEMATOCRIT, BLOOD: Hemoglobin: 13.9 g/dL (ref 13.0–17.0)

## 2013-07-23 MED ORDER — TAMSULOSIN HCL 0.4 MG PO CAPS: 0.4000 mg | ORAL_CAPSULE | Freq: Every day | ORAL | Status: DC

## 2013-07-23 MED ORDER — CEPHALEXIN 500 MG PO CAPS: 500.0000 mg | ORAL_CAPSULE | Freq: Three times a day (TID) | ORAL | Status: DC

## 2013-07-23 MED ORDER — OXYBUTYNIN CHLORIDE 5 MG PO TABS: 5.0000 mg | ORAL_TABLET | Freq: Three times a day (TID) | ORAL | Status: DC | PRN

## 2013-07-23 MED ORDER — OXYCODONE HCL 5 MG PO TABS: 5.0000 mg | ORAL_TABLET | ORAL | Status: DC | PRN

## 2013-07-23 MED ORDER — SENNOSIDES-DOCUSATE SODIUM 8.6-50 MG PO TABS: 1.0000 | ORAL_TABLET | Freq: Two times a day (BID) | ORAL | Status: DC

## 2013-07-23 NOTE — Discharge Summary (Signed)
Physician Discharge Summary  Patient ID: [name] MRN: [id number] DOB/AGE: [birth year] 62 y.o.  Admit date: 07/22/2013 Discharge date: 07/23/2013  Admission Diagnoses: Left nephrolithiasis. Left ureteropelvic junction stenosis.  Discharge Diagnoses:  Left nephrolithiasis. Left ureteropelvic junction stenosis.  Discharged Condition: stable  Hospital Course:  Please this patient was admitted following left percutaneous nephrostolithotomy.  He was admitted for pain control and observation.  Extremities able be controlled early with oral medications.  He was able to ambulate early and SCDs were used to prevent DVTs.  He was able to advance her diet to regular diet.  His Foley catheter was removed in the morning and he was able to void on postoperative day one.  The nephrostomy tube was clamped early and he was able to tolerate this by using oral pain medications.  The nephrostomy tube was then removed by his physician and a urostomy device was placed over this.  The patient and family were taught how to take care of this.  He had a KUB the following day which revealed no significant residual stone fragments.  It showed that the left ureter stent remained in good position.  It was felt that he could be discharged home discharge disposition was stable.  Consults: None  Significant Diagnostic Studies: radiology: KUB: Negative remaining stone fragments.  Treatments: surgery: Left percutaneous nephrostolithotomy.  Discharge Exam: Blood pressure 115/60, pulse 85, temperature 98.5 F (36.9 C), temperature source Oral, resp. rate 14, height 6' (1.829 m), weight 78.472 kg (173 lb), SpO2 99.00%. Refer to PE from progress note on day of surgery.  Disposition: Home - self care.   Discharge Orders   Future Orders Complete By Expires   Discharge patient  As directed        Medication List    STOP taking these medications       fish oil-omega-3 fatty acids 1000 MG capsule      TAKE  these medications       cephALEXin 500 MG capsule  Commonly known as:  KEFLEX  Take 1 capsule (500 mg total) by mouth 3 (three) times daily.     fluticasone 50 MCG/ACT nasal spray  Commonly known as:  FLONASE  Place 1 spray into the nose daily.     oxybutynin 5 MG tablet  Commonly known as:  DITROPAN  Take 1 tablet (5 mg total) by mouth every 8 (eight) hours as needed (bladder spasms).     oxyCODONE 5 MG immediate release tablet  Commonly known as:  Oxy IR/ROXICODONE  Take 1-2 tablets (5-10 mg total) by mouth every 4 (four) hours as needed.     propranolol 20 MG tablet  Commonly known as:  INDERAL  Take 20 mg by mouth every 7 (seven) days. TAKES ON Thursday  PM ONLY PRIOR TO PERFORMING FOR STAGE FRIGHT     senna-docusate 8.6-50 MG per tablet  Commonly known as:  Senokot-S  Take 1 tablet by mouth 2 (two) times daily.     tamsulosin 0.4 MG Caps capsule  Commonly known as:  FLOMAX  Take 1 capsule (0.4 mg total) by mouth at bedtime.           Follow-up Information   Follow up with Milford Cage, MD On 07/31/2013. (4:00 pm)    Specialty:  Urology   Contact information:   [address] [place] FLOOR [address], [place] [address] [phone]       Signed: Milford Cage 07/23/2013, 1:47 PM

## 2013-07-23 NOTE — Progress Notes (Signed)
Urology Progress Note  Subjective:     No acute urologic events overnight.  He is tolerating capping of his nephrostomy tube well.  His catheter was recently removed and he has not been able to void.  His pain was well-controlled with oral medications.  He has ambulated with some slight lightheadedness, which she thinks is due to not eating.  We discussed the surgical findings and the course of the surgery.  I explained that this stone was a conglomeration of small stone which fragmented when grasped.  Because of this I used the Ultratome to try to suction out all of the fragments, but notably there would likely be some small fragments left behind.  I explained the findings of his UPJ narrowing.  I explained that this could be secondary due to inflammation of the stone, but I suspect that this may be an issue that occurred once down and likely accounts for the rapid growth in the size of his stone.  I explained that I would recommend leaving his ureter stent for proximal 4 weeks.  ROS: Negative: nausea or emesis.  Objective:  Patient Vitals for the past 24 hrs:  BP Temp Temp src Pulse Resp SpO2 Height Weight  07/23/13 0606 115/60 mmHg - - 85 - - - -  07/23/13 0603 113/55 mmHg 98.5 F (36.9 C) Oral 81 14 99 % - -  07/23/13 0222 117/60 mmHg - - 82 - - - -  07/23/13 0205 93/43 mmHg 98.5 F (36.9 C) Oral 83 12 98 % - -  07/22/13 2037 121/59 mmHg 97.9 F (36.6 C) Oral 73 14 100 % - -  07/22/13 1755 135/76 mmHg 97.4 F (36.3 C) Oral 68 16 100 % 6' (1.829 m) 78.472 kg (173 lb)  07/22/13 1739 135/76 mmHg 97.4 F (36.3 C) - 68 16 100 % - -  07/22/13 1715 137/84 mmHg 97.4 F (36.3 C) - 71 9 100 % - -  07/22/13 1700 132/91 mmHg - - 64 14 100 % - -  07/22/13 1645 131/80 mmHg - - 64 14 100 % - -  07/22/13 1630 126/104 mmHg - - 57 13 99 % - -  07/22/13 1615 138/95 mmHg 97.4 F (36.3 C) - 61 15 99 % - -  07/22/13 1600 146/80 mmHg - - 61 12 - - -  07/22/13 1541 160/54 mmHg 97.4 F (36.3 C) -  67 10 100 % - -    Physical Exam: General:  No acute distress, awake Cardiovascular:    [x]   S1/S2 present, RRR  []   Irregularly irregular Chest:  CTA-B Abdomen:               []  Soft, appropriately TTP  [x]  Soft, NTTP  []  Soft, appropriately TTP, incision(s) clean/dry/intact  Genitourinary: Left flank without ecchymosis. Leakage of urine around nephrostomy tube. Nephrostomy tube clamped. Urine in the tube is pink without clots. Foley:  Discontinued.    I/O last 3 completed shifts: In: 1750 [I.V.:1750] Out: 480 [Urine:430; Blood:50]  Recent Labs     07/22/13  0800  07/22/13  1624  07/23/13  0433  HGB  15.2  14.4  13.9  WBC  6.8   --    --   PLT  213   --    --     Recent Labs     07/22/13  0800  07/23/13  0433  NA  138  137  K  3.8  4.2  CL  104  106  CO2  30  23  BUN  21  16  CREATININE  0.84  0.73  CALCIUM  9.3  8.4  GFRNONAA  >90  >90  GFRAA  >90  >90     Recent Labs     07/22/13  0800  INR  0.98  APTT  25     No components found with this basename: ABG,     Length of stay: 1 days.  Assessment: Left nephrolithiasis. Left UPJ stenosis. POD#1 Left percutaneous nephrostolithotomy with left antegrade ureteral stent placement.   Plan: -Foley discontinued.  -Nephrostomy capped. If he tolerates this we will discontinue it later today. If not, he may be discharged home with the tube in place.  -Decrease IV fluid rate to 50cc/hr.  -Regular diet.   Natalia Leatherwood, MD [phone]

## 2013-07-23 NOTE — Care Management Note (Signed)
    Page 1 of 1   07/23/2013     12:34:42 PM   CARE MANAGEMENT NOTE 07/23/2013  Patient:  [name], [name]   Account Number:  [id number]  Date Initiated:  07/23/2013  Documentation initiated by:  Lanier Clam  Subjective/Objective Assessment:   62Y/O M ADMITTED W/L NEPHROLITHIASIS.     Action/Plan:   FROM HOME.   Anticipated DC Date:  07/23/2013   Anticipated DC Plan:  HOME/SELF CARE      DC Planning Services  CM consult      Choice offered to / List presented to:             Status of service:  Completed, signed off Medicare Important Message given?   (If response is "NO", the following Medicare IM given date fields will be blank) Date Medicare IM given:   Date Additional Medicare IM given:    Discharge Disposition:  HOME/SELF CARE  Per UR Regulation:  Reviewed for med. necessity/level of care/duration of stay  If discussed at Long Length of Stay Meetings, dates discussed:    Comments:  07/23/13 Alicea Wente RN,BSN NCM [phone] POD#1 L PCN.NO ANTICIPATED D/C NEEDS.

## 2013-09-02 ENCOUNTER — Other Ambulatory Visit (HOSPITAL_COMMUNITY): Payer: Self-pay | Admitting: Urology

## 2013-09-02 DIAGNOSIS — N135 Crossing vessel and stricture of ureter without hydronephrosis: Secondary | ICD-10-CM

## 2013-10-02 ENCOUNTER — Ambulatory Visit (HOSPITAL_COMMUNITY)
Admission: RE | Admit: 2013-10-02 | Discharge: 2013-10-02 | Disposition: A | Discharge status: Home / Self Care | Payer: BC Managed Care – PPO | Source: Ambulatory Visit | Attending: Urology | Admitting: Urology

## 2013-10-02 DIAGNOSIS — N135 Crossing vessel and stricture of ureter without hydronephrosis: Secondary | ICD-10-CM | POA: Insufficient documentation

## 2013-10-02 MED ORDER — FUROSEMIDE 10 MG/ML IJ SOLN
40.0000 mg | Freq: Once | INTRAMUSCULAR | Status: AC
  Administered 2013-10-02: 40 mg via INTRAVENOUS
  Filled 2013-10-02: qty 4

## 2013-10-02 MED ORDER — TECHNETIUM TC 99M MERTIATIDE
15.9000 | Freq: Once | INTRAVENOUS | Status: AC | PRN
  Administered 2013-10-02: 15.9 via INTRAVENOUS

## 2013-10-07 ENCOUNTER — Ambulatory Visit (INDEPENDENT_AMBULATORY_CARE_PROVIDER_SITE_OTHER): Payer: BC Managed Care – PPO | Admitting: Sports Medicine

## 2013-10-07 ENCOUNTER — Encounter: Payer: Self-pay | Admitting: Sports Medicine

## 2013-10-07 ENCOUNTER — Encounter (INDEPENDENT_AMBULATORY_CARE_PROVIDER_SITE_OTHER): Payer: Self-pay

## 2013-10-07 VITALS — BP 111/77 | Ht 72.0 in | Wt 173.0 lb

## 2013-10-07 DIAGNOSIS — M25569 Pain in unspecified knee: Secondary | ICD-10-CM

## 2013-10-07 DIAGNOSIS — M25562 Pain in left knee: Secondary | ICD-10-CM

## 2013-10-07 DIAGNOSIS — M6281 Muscle weakness (generalized): Secondary | ICD-10-CM

## 2013-10-07 NOTE — Progress Notes (Signed)
Subjective:    Patient ID: [name] is a 62 y.o. male.  Chief Complaint: HPI Patient is a pleasant 62 yo male who present today for left knee pain. He reports that over 25 years ago he was participating in an intense hike that resulted in chronic intermittent knee pain for several years. He does not recall any injury at that time. Every time he participates in a long hike on the weekends he develops acute on chronic left knee pain. The pain is localize to the anterior knee around the patella both quad/ patella tendons. The pain is intermittent pain will resolve several days after intense activity period with ice and NSAIDs. He also reports additional history of lumbar disc hernia causing compression on L3-4L nerve root causing weakness and radiculopathy down into the left quad resulting in quad weakness and atrophy.   Social History   Occupational History  . Not on file.   Social History Main Topics  . Smoking status: Never Smoker   . Smokeless tobacco: Never Used  . Alcohol Use: 4.2 oz/week    7 Glasses of wine per week     Comment: GLASS WINE Q NIGHT  . Drug Use: No  . Sexual Activity: Not on file    ROS   ROS: negative except what is present in HPI     Objective:   Ortho Exam  Neuro: A&O x3. Sensation to light touch lower extremities equal and intact bilaterally. Right left dominate. Normal gait. Knee exam:  Observation: No ecchymosis, no knee effusion visualized, no previous surgical scars, patella in place, no quad muscle atrophy Palpation: No evidence of knee effusion with negative ballotable patella sign, no over the suprapatellar bursa, no pain no their tibial tuberosity, or patellar tendons, no pain to quadriceps tendon.  No pain along the medial or lateral joint line. Range of motion: Full ROM at the hip. Patient has a full flexion, mild decrease in extension of approximately 1-2, normal patellar tracking, negative patella apprehension test Ligamentous testing:  Negative Lachman's test, negative anterior drawer, negative posterior drawer, negative valgus stress test, negative varus stress test. Meniscal evaluation: Negative McMurray's test, negative thessaly's test, Normal gait   Mild bilateral pronation   MSK [place]:  Normal patella tendon Quad tendon with mild hypoechoic changes and bone spur extending on the proximate patella into the quad tendon Moderate effusion in the suprapatellar recess  Normal medial and lateral joint line with no significant meniscus changes   Assessment:  1. Bone Spur at the superior patella pole extending  2. Moderate decrease VMO weakness     Plan:    Recommend working on quad strengthening with straight leg raises, short arch knee flexion, and short arch 45 deg squats with no weight, also hip adduction and abduction strengthening. Work on these exercises for the next 4-6 weeks. Patient also given scaphoid pad to his insoles to correct some mild pronation.

## 2013-10-21 DIAGNOSIS — M6281 Muscle weakness (generalized): Secondary | ICD-10-CM | POA: Insufficient documentation

## 2013-10-21 DIAGNOSIS — M25562 Pain in left knee: Secondary | ICD-10-CM | POA: Insufficient documentation

## 2013-10-21 NOTE — Assessment & Plan Note (Signed)
This appears on ultrasound be a quadriceps tendinitis  We started him on both hip and knee exercises  Modified activities  Recheck him in 6 weeks. If not improving he may be a candidate for compression sleeves, nitroglycerin treatment or other interventions

## 2013-10-21 NOTE — Assessment & Plan Note (Signed)
Recommended a series of quadriceps strengthening exercises  Reason I range of motion that does not cause pain over the quadriceps tendon

## 2013-11-24 ENCOUNTER — Ambulatory Visit (INDEPENDENT_AMBULATORY_CARE_PROVIDER_SITE_OTHER): Payer: BC Managed Care – PPO | Admitting: Sports Medicine

## 2013-11-24 ENCOUNTER — Encounter: Payer: Self-pay | Admitting: Sports Medicine

## 2013-11-24 VITALS — BP 123/81

## 2013-11-24 DIAGNOSIS — M7712 Lateral epicondylitis, left elbow: Secondary | ICD-10-CM

## 2013-11-24 DIAGNOSIS — M6281 Muscle weakness (generalized): Secondary | ICD-10-CM

## 2013-11-24 DIAGNOSIS — M25562 Pain in left knee: Secondary | ICD-10-CM

## 2013-11-24 DIAGNOSIS — M25569 Pain in unspecified knee: Secondary | ICD-10-CM

## 2013-11-24 DIAGNOSIS — M7710 Lateral epicondylitis, unspecified elbow: Secondary | ICD-10-CM

## 2013-11-24 NOTE — Assessment & Plan Note (Signed)
Significant improvement on HEP and compression sleeve Keep up this plan

## 2013-11-24 NOTE — Assessment & Plan Note (Signed)
Compression sleeve for elbow activity including french horn  HEP   Prn reck

## 2013-11-24 NOTE — Assessment & Plan Note (Signed)
Some improvement  Keep up exercises that are not painful  Add hip exercises

## 2013-11-24 NOTE — Progress Notes (Signed)
   Subjective:    Patient ID: [name], male    DOB: 11/03/1951, 63 y.o.   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  He was last seen in our office on 10/07/2013 at which time he was given a compression sleeve in addition to exercises intended to strengthen his quadriceps and hip abductors and improve knee stability.  U/S demonstrated a bone spur and effusion.  He feels that his symptoms are modestly improved, although he still feels that he is weak in his left leg, particularly on extension.  He is able to perform some of his exercises successfully and has noted that he is getting stronger, but he has radicular pains down his legs from a herniated disc at L3/4 when he performs straight leg raises.  He is aware that he has some calcification in his middle patellar tendon from his previous visit and wonders if he will need it surgically removed.  He also notes left elbow pain which has been evolving over the last few weeks.  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   Review of Systems ROS is negative or stable except as otherwise stated in the HPI.    Objective:   Physical Exam Gen:  Well-appearing, NAD MSK:   Left wrist extensors show normal strength with only minimal discomfort on loading Mild TTP at epicondule of left elbow  Mild atrophy of the left quadriceps compared to right, no TTP; no apparent effusion; Full ROM bilaterally, Strength 5/5 RLE, 4-/5 LLE; increased laxity of the left patella to lateral translation  U/S Left elbow:  Split in proximal extensor tendons at left lateral epicondyle with neovascularization Left Knee:  Demonstrated left suprapatellar bone spur with minimal effusion;  The amount of effusion is decreased by about 80%       Assessment & Plan:  This is a 63yo male with history of lumbar disc  herniation at L3/4 who presents for follow-up of chronic left knee pain attributed to a hiking injury.  Appears to be progressing well using regular quad and hip abductor strengthening exercises and knee compression sleeve.  Surgery is inappropriate at this time, and it is possible that the calcification in his quad tendon may resorb somewhat over time.    His lateral epicondylitis in his left elbow is minor based on his symptoms and US exam, and he may benefit with wrist extension exercises and a compression sleeve.  Plan: - Cont quad and hip abductor exercises as tolerated; avoid those exercises which aggravate back pain - Cont to use knee compression sleeve when active for added stability and prevention of effusion - Use elbow compression sleeve as needed in addition to demonstrated wrist extensor exercises - F/u in this office in 2 months

## 2014-02-16 ENCOUNTER — Ambulatory Visit (INDEPENDENT_AMBULATORY_CARE_PROVIDER_SITE_OTHER): Payer: BC Managed Care – PPO | Admitting: Sports Medicine

## 2014-02-16 ENCOUNTER — Encounter: Payer: Self-pay | Admitting: Sports Medicine

## 2014-02-16 VITALS — BP 119/80 | Ht 72.0 in | Wt 172.0 lb

## 2014-02-16 DIAGNOSIS — M7712 Lateral epicondylitis, left elbow: Secondary | ICD-10-CM

## 2014-02-16 DIAGNOSIS — M7710 Lateral epicondylitis, unspecified elbow: Secondary | ICD-10-CM

## 2014-02-16 DIAGNOSIS — M25562 Pain in left knee: Secondary | ICD-10-CM

## 2014-02-16 DIAGNOSIS — M6281 Muscle weakness (generalized): Secondary | ICD-10-CM

## 2014-02-16 DIAGNOSIS — M25569 Pain in unspecified knee: Secondary | ICD-10-CM

## 2014-02-16 NOTE — Progress Notes (Signed)
   Subjective:    Patient ID: [name], male    DOB: Sep 14, 1951, 63 y.o.   MRN: [id number]  HPI  Pt presents to clinic for f/u of Lt knee which is improving. Does home exercises 2-3 times per week. Has worn bodyhelix full knee compression.    His rt knee now having some pain. Has tried bodyhelix on rt knee which helps.   He has had no sign of instability and wants to do some hiking in the next few weeks  Left elbow Compression sleeve seem to try and has not been comfortable He did changes his hand position on the [place] horn and hass less pain He notes if he drives with his left hand and afterward while the lateral elbow will be painful   Review of Systems     Objective:   Physical Exam No acute distress BP 119/80  Ht 6' (1.829 m)  Wt 172 lb (78.019 kg)  BMI 23.32 kg/m2  Knee exam:  Minimal crepitation with full extension  Full flexion  Minor clicking with Mcmurray's, but stable  Lauchman negative Thessaly negative   Rt knee: Full flexion and extension Mcmurray's negative Lauchman negative  Thessaly negative    On both knees there appears to be some improvement in quadriceps strength He also has an improvement in hip abduction strength Left knee stress still lags somewhat behind the right probably from his previous lumbar disc issues  Lt elbow: Strength testing good No pain with grip squeeze or book test  No pain on resisted extension of the wrist or fingers      Assessment & Plan:

## 2014-02-16 NOTE — Assessment & Plan Note (Signed)
Probably has some chronic changes on his ultrasound from last visit clinically he has resolved this  We gave him information to work with [employer] on perhaps trading his elbow sleeve if he feels he needs it for activity

## 2014-02-16 NOTE — Assessment & Plan Note (Signed)
Pain is less and there is no obvious swelling today  I suggested keeping exercises at home but if this recurs come back for further evaluation  We gave him a second compression sleeve as this seems to help his symptoms

## 2014-02-16 NOTE — Assessment & Plan Note (Signed)
This is slightly improved  I think if he will be consistent with the exercise he'll see a lot of improvement of the next 2-3 months  Recheck when necessary if symptoms recur

## 2014-02-16 NOTE — Patient Instructions (Addendum)
Contact Shauntell Iglesia Dillingham, who is a rep for [employer]   She should be able to fit you for an elbow sleeve   Please follow up as needed  Thank you for seeing [place] today!

## 2014-02-18 ENCOUNTER — Encounter: Payer: Self-pay | Admitting: Sports Medicine

## 2014-02-18 ENCOUNTER — Other Ambulatory Visit: Payer: Self-pay | Admitting: *Deleted

## 2016-11-27 DIAGNOSIS — R69 Illness, unspecified: Secondary | ICD-10-CM | POA: Diagnosis not present

## 2017-04-04 DIAGNOSIS — Z1283 Encounter for screening for malignant neoplasm of skin: Secondary | ICD-10-CM | POA: Diagnosis not present

## 2017-04-04 DIAGNOSIS — L821 Other seborrheic keratosis: Secondary | ICD-10-CM | POA: Diagnosis not present

## 2017-04-04 DIAGNOSIS — B351 Tinea unguium: Secondary | ICD-10-CM | POA: Diagnosis not present

## 2017-06-13 DIAGNOSIS — Z0100 Encounter for examination of eyes and vision without abnormal findings: Secondary | ICD-10-CM | POA: Diagnosis not present

## 2017-06-28 DIAGNOSIS — E7800 Pure hypercholesterolemia, unspecified: Secondary | ICD-10-CM | POA: Diagnosis not present

## 2017-06-28 DIAGNOSIS — Z125 Encounter for screening for malignant neoplasm of prostate: Secondary | ICD-10-CM | POA: Diagnosis not present

## 2017-06-28 DIAGNOSIS — N200 Calculus of kidney: Secondary | ICD-10-CM | POA: Diagnosis not present

## 2017-06-28 DIAGNOSIS — E162 Hypoglycemia, unspecified: Secondary | ICD-10-CM | POA: Diagnosis not present

## 2017-06-28 DIAGNOSIS — Z0000 Encounter for general adult medical examination without abnormal findings: Secondary | ICD-10-CM | POA: Diagnosis not present

## 2017-06-28 DIAGNOSIS — Z23 Encounter for immunization: Secondary | ICD-10-CM | POA: Diagnosis not present

## 2017-06-28 DIAGNOSIS — R69 Illness, unspecified: Secondary | ICD-10-CM | POA: Diagnosis not present

## 2017-07-08 DIAGNOSIS — R69 Illness, unspecified: Secondary | ICD-10-CM | POA: Diagnosis not present

## 2017-08-13 DIAGNOSIS — R197 Diarrhea, unspecified: Secondary | ICD-10-CM | POA: Diagnosis not present

## 2017-12-19 DIAGNOSIS — R69 Illness, unspecified: Secondary | ICD-10-CM | POA: Diagnosis not present

## 2017-12-31 DIAGNOSIS — H2512 Age-related nuclear cataract, left eye: Secondary | ICD-10-CM | POA: Diagnosis not present

## 2017-12-31 DIAGNOSIS — H25812 Combined forms of age-related cataract, left eye: Secondary | ICD-10-CM | POA: Diagnosis not present

## 2018-01-08 DIAGNOSIS — Z0100 Encounter for examination of eyes and vision without abnormal findings: Secondary | ICD-10-CM | POA: Diagnosis not present

## 2018-01-08 DIAGNOSIS — R69 Illness, unspecified: Secondary | ICD-10-CM | POA: Diagnosis not present

## 2018-01-14 DIAGNOSIS — H2512 Age-related nuclear cataract, left eye: Secondary | ICD-10-CM | POA: Diagnosis not present

## 2018-02-04 DIAGNOSIS — H2511 Age-related nuclear cataract, right eye: Secondary | ICD-10-CM | POA: Diagnosis not present

## 2018-02-04 DIAGNOSIS — H2512 Age-related nuclear cataract, left eye: Secondary | ICD-10-CM | POA: Diagnosis not present

## 2018-02-04 DIAGNOSIS — H25811 Combined forms of age-related cataract, right eye: Secondary | ICD-10-CM | POA: Diagnosis not present

## 2018-09-21 DIAGNOSIS — R69 Illness, unspecified: Secondary | ICD-10-CM | POA: Diagnosis not present

## 2018-09-22 DIAGNOSIS — R69 Illness, unspecified: Secondary | ICD-10-CM | POA: Diagnosis not present

## 2018-10-06 DIAGNOSIS — E162 Hypoglycemia, unspecified: Secondary | ICD-10-CM | POA: Diagnosis not present

## 2018-10-06 DIAGNOSIS — Z0000 Encounter for general adult medical examination without abnormal findings: Secondary | ICD-10-CM | POA: Diagnosis not present

## 2018-10-06 DIAGNOSIS — Z23 Encounter for immunization: Secondary | ICD-10-CM | POA: Diagnosis not present

## 2018-10-06 DIAGNOSIS — R69 Illness, unspecified: Secondary | ICD-10-CM | POA: Diagnosis not present

## 2018-10-06 DIAGNOSIS — E7800 Pure hypercholesterolemia, unspecified: Secondary | ICD-10-CM | POA: Diagnosis not present

## 2018-10-06 DIAGNOSIS — Z125 Encounter for screening for malignant neoplasm of prostate: Secondary | ICD-10-CM | POA: Diagnosis not present

## 2018-10-06 DIAGNOSIS — N200 Calculus of kidney: Secondary | ICD-10-CM | POA: Diagnosis not present

## 2018-11-10 DIAGNOSIS — B351 Tinea unguium: Secondary | ICD-10-CM | POA: Diagnosis not present

## 2018-11-10 DIAGNOSIS — D485 Neoplasm of uncertain behavior of skin: Secondary | ICD-10-CM | POA: Diagnosis not present

## 2018-11-10 DIAGNOSIS — D225 Melanocytic nevi of trunk: Secondary | ICD-10-CM | POA: Diagnosis not present

## 2018-11-10 DIAGNOSIS — L820 Inflamed seborrheic keratosis: Secondary | ICD-10-CM | POA: Diagnosis not present

## 2018-11-10 DIAGNOSIS — Z1283 Encounter for screening for malignant neoplasm of skin: Secondary | ICD-10-CM | POA: Diagnosis not present

## 2019-03-23 DIAGNOSIS — H5789 Other specified disorders of eye and adnexa: Secondary | ICD-10-CM | POA: Diagnosis not present

## 2019-03-23 DIAGNOSIS — H2511 Age-related nuclear cataract, right eye: Secondary | ICD-10-CM | POA: Diagnosis not present

## 2019-03-23 DIAGNOSIS — Z961 Presence of intraocular lens: Secondary | ICD-10-CM | POA: Diagnosis not present

## 2019-03-23 DIAGNOSIS — T8529XD Other mechanical complication of intraocular lens, subsequent encounter: Secondary | ICD-10-CM | POA: Diagnosis not present

## 2019-03-30 DIAGNOSIS — T8529XD Other mechanical complication of intraocular lens, subsequent encounter: Secondary | ICD-10-CM | POA: Diagnosis not present

## 2019-03-30 DIAGNOSIS — Z961 Presence of intraocular lens: Secondary | ICD-10-CM | POA: Diagnosis not present

## 2019-03-30 DIAGNOSIS — H5789 Other specified disorders of eye and adnexa: Secondary | ICD-10-CM | POA: Diagnosis not present

## 2019-04-14 DIAGNOSIS — H5789 Other specified disorders of eye and adnexa: Secondary | ICD-10-CM | POA: Diagnosis not present

## 2019-04-14 DIAGNOSIS — Z961 Presence of intraocular lens: Secondary | ICD-10-CM | POA: Diagnosis not present

## 2019-04-15 DIAGNOSIS — T8529XD Other mechanical complication of intraocular lens, subsequent encounter: Secondary | ICD-10-CM | POA: Diagnosis not present

## 2019-05-12 DIAGNOSIS — H5213 Myopia, bilateral: Secondary | ICD-10-CM | POA: Diagnosis not present

## 2019-05-22 DIAGNOSIS — R0602 Shortness of breath: Secondary | ICD-10-CM | POA: Diagnosis not present

## 2019-06-04 DIAGNOSIS — Z1283 Encounter for screening for malignant neoplasm of skin: Secondary | ICD-10-CM | POA: Diagnosis not present

## 2019-06-04 DIAGNOSIS — D225 Melanocytic nevi of trunk: Secondary | ICD-10-CM | POA: Diagnosis not present

## 2019-06-04 DIAGNOSIS — B351 Tinea unguium: Secondary | ICD-10-CM | POA: Diagnosis not present

## 2019-06-04 DIAGNOSIS — L820 Inflamed seborrheic keratosis: Secondary | ICD-10-CM | POA: Diagnosis not present

## 2019-06-04 DIAGNOSIS — L821 Other seborrheic keratosis: Secondary | ICD-10-CM | POA: Diagnosis not present

## 2019-06-22 DIAGNOSIS — R69 Illness, unspecified: Secondary | ICD-10-CM | POA: Diagnosis not present

## 2019-06-29 DIAGNOSIS — H833X3 Noise effects on inner ear, bilateral: Secondary | ICD-10-CM | POA: Diagnosis not present

## 2019-06-29 DIAGNOSIS — H9113 Presbycusis, bilateral: Secondary | ICD-10-CM | POA: Diagnosis not present

## 2019-06-29 DIAGNOSIS — H903 Sensorineural hearing loss, bilateral: Secondary | ICD-10-CM | POA: Diagnosis not present

## 2019-07-06 DIAGNOSIS — Z0000 Encounter for general adult medical examination without abnormal findings: Secondary | ICD-10-CM | POA: Diagnosis not present

## 2019-07-15 DIAGNOSIS — R69 Illness, unspecified: Secondary | ICD-10-CM | POA: Diagnosis not present

## 2019-09-17 DIAGNOSIS — Z03818 Encounter for observation for suspected exposure to other biological agents ruled out: Secondary | ICD-10-CM | POA: Diagnosis not present

## 2019-09-21 DIAGNOSIS — Z03818 Encounter for observation for suspected exposure to other biological agents ruled out: Secondary | ICD-10-CM | POA: Diagnosis not present

## 2019-10-12 DIAGNOSIS — Z1389 Encounter for screening for other disorder: Secondary | ICD-10-CM | POA: Diagnosis not present

## 2019-10-12 DIAGNOSIS — N200 Calculus of kidney: Secondary | ICD-10-CM | POA: Diagnosis not present

## 2019-10-12 DIAGNOSIS — E7800 Pure hypercholesterolemia, unspecified: Secondary | ICD-10-CM | POA: Diagnosis not present

## 2019-10-12 DIAGNOSIS — Z0000 Encounter for general adult medical examination without abnormal findings: Secondary | ICD-10-CM | POA: Diagnosis not present

## 2019-10-12 DIAGNOSIS — Z125 Encounter for screening for malignant neoplasm of prostate: Secondary | ICD-10-CM | POA: Diagnosis not present

## 2019-10-12 DIAGNOSIS — R69 Illness, unspecified: Secondary | ICD-10-CM | POA: Diagnosis not present

## 2019-12-29 DIAGNOSIS — Z0100 Encounter for examination of eyes and vision without abnormal findings: Secondary | ICD-10-CM | POA: Diagnosis not present

## 2020-01-01 DIAGNOSIS — J029 Acute pharyngitis, unspecified: Secondary | ICD-10-CM | POA: Diagnosis not present

## 2020-02-02 DIAGNOSIS — R69 Illness, unspecified: Secondary | ICD-10-CM | POA: Diagnosis not present

## 2020-08-09 ENCOUNTER — Ambulatory Visit: Payer: Self-pay | Admitting: Sports Medicine

## 2020-08-09 DIAGNOSIS — R69 Illness, unspecified: Secondary | ICD-10-CM | POA: Diagnosis not present

## 2020-09-08 DIAGNOSIS — R69 Illness, unspecified: Secondary | ICD-10-CM | POA: Diagnosis not present

## 2020-11-22 DIAGNOSIS — R0981 Nasal congestion: Secondary | ICD-10-CM | POA: Diagnosis not present

## 2020-11-22 DIAGNOSIS — J029 Acute pharyngitis, unspecified: Secondary | ICD-10-CM | POA: Diagnosis not present

## 2020-11-22 DIAGNOSIS — R519 Headache, unspecified: Secondary | ICD-10-CM | POA: Diagnosis not present

## 2020-12-20 DIAGNOSIS — Z125 Encounter for screening for malignant neoplasm of prostate: Secondary | ICD-10-CM | POA: Diagnosis not present

## 2020-12-20 DIAGNOSIS — Z1389 Encounter for screening for other disorder: Secondary | ICD-10-CM | POA: Diagnosis not present

## 2020-12-20 DIAGNOSIS — Z23 Encounter for immunization: Secondary | ICD-10-CM | POA: Diagnosis not present

## 2020-12-20 DIAGNOSIS — F330 Major depressive disorder, recurrent, mild: Secondary | ICD-10-CM | POA: Diagnosis not present

## 2020-12-20 DIAGNOSIS — E7800 Pure hypercholesterolemia, unspecified: Secondary | ICD-10-CM | POA: Diagnosis not present

## 2020-12-20 DIAGNOSIS — R69 Illness, unspecified: Secondary | ICD-10-CM | POA: Diagnosis not present

## 2020-12-20 DIAGNOSIS — E162 Hypoglycemia, unspecified: Secondary | ICD-10-CM | POA: Diagnosis not present

## 2020-12-20 DIAGNOSIS — Z0000 Encounter for general adult medical examination without abnormal findings: Secondary | ICD-10-CM | POA: Diagnosis not present

## 2021-02-14 DIAGNOSIS — Z1283 Encounter for screening for malignant neoplasm of skin: Secondary | ICD-10-CM | POA: Diagnosis not present

## 2021-02-14 DIAGNOSIS — L308 Other specified dermatitis: Secondary | ICD-10-CM | POA: Diagnosis not present

## 2021-02-14 DIAGNOSIS — L821 Other seborrheic keratosis: Secondary | ICD-10-CM | POA: Diagnosis not present

## 2021-02-14 DIAGNOSIS — L603 Nail dystrophy: Secondary | ICD-10-CM | POA: Diagnosis not present

## 2021-06-06 DIAGNOSIS — L309 Dermatitis, unspecified: Secondary | ICD-10-CM | POA: Diagnosis not present

## 2021-06-06 DIAGNOSIS — G250 Essential tremor: Secondary | ICD-10-CM | POA: Diagnosis not present

## 2021-06-06 DIAGNOSIS — R69 Illness, unspecified: Secondary | ICD-10-CM | POA: Diagnosis not present

## 2021-06-06 DIAGNOSIS — Z803 Family history of malignant neoplasm of breast: Secondary | ICD-10-CM | POA: Diagnosis not present

## 2021-06-06 DIAGNOSIS — H04129 Dry eye syndrome of unspecified lacrimal gland: Secondary | ICD-10-CM | POA: Diagnosis not present

## 2021-07-10 ENCOUNTER — Ambulatory Visit (INDEPENDENT_AMBULATORY_CARE_PROVIDER_SITE_OTHER): Payer: Medicare HMO | Admitting: Family Medicine

## 2021-07-10 ENCOUNTER — Other Ambulatory Visit: Payer: Self-pay

## 2021-07-10 ENCOUNTER — Encounter (INDEPENDENT_AMBULATORY_CARE_PROVIDER_SITE_OTHER): Payer: Self-pay

## 2021-07-10 VITALS — Ht 71.0 in | Wt 162.0 lb

## 2021-07-10 DIAGNOSIS — M48061 Spinal stenosis, lumbar region without neurogenic claudication: Secondary | ICD-10-CM | POA: Diagnosis not present

## 2021-07-10 DIAGNOSIS — M25511 Pain in right shoulder: Secondary | ICD-10-CM

## 2021-07-10 NOTE — Progress Notes (Signed)
PCP: [facility]  Subjective:   HPI: Patient is a 70 y.o. male here for evaluation of bilateral leg pain, and right shoulder pain.  Mr. Szeto is a 70 year old gentleman with a past medical history significant for degenerative disc disease of the lumbar spine as evidenced on MRI from 2004.  He presents today for evaluation of bilateral lower extremity pain, and pain in his right shoulder.  He endorses a recent history of pain along the anterior portion of his legs, beginning proximally at his thighs and radiating towards his shins.  He denies numbness/tingling in his legs.  Additionally denies shooting pains, and bowel/bladder incontinence.  His pain is worse with walking and with getting in and out of bed.  Pain seems to be relieved with bending forward and with squatting at the waist and hugging his knees. Mr. Kise has been biking and hikiing recently, both of which do not make his pain worse. He is concerned that his pain may be related to his back vs hip issues.   Today Mr. Aston also endorses right shoulder pain. He was recently pulling a kayak when he began to experience pain along the superior aspect of his right shoulder. Initially he was unable to raise his arm above his head, but states that his ROM is gradually improving. Pain is exacerbated by reaching overheard and with extension of the right arm. Denies pain in his R elbow or hand.   Of note, Mr. Barnett also describes pain and a knot along the medial portion of the left foot. He reports a prior history of fracture along the first and second metatarsals of the left foot for which he underwent surgery.  Past Medical History:  Diagnosis Date   Arthritis    Cervical disc herniation    C 8   Depression    MILD, NO CURRENT MEDS FOR   DJD (degenerative joint disease)    GERD (gastroesophageal reflux disease)    History of kidney stones OCT 2012   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    Current  Outpatient Medications on File Prior to Visit  Medication Sig Dispense Refill   fluticasone (FLONASE) 50 MCG/ACT nasal spray Place 1 spray into the nose daily.     propranolol (INDERAL) 20 MG tablet Take 20 mg by mouth every 7 (seven) days. TAKES ON Thursday  PM ONLY PRIOR TO PERFORMING FOR STAGE FRIGHT     No current facility-administered medications on file prior to visit.    Past Surgical History:  Procedure Laterality Date   CERVICAL DISC FUSED  15 YRS AGO   C 6 TO C 8   LASIX EYE SURGERY Bilateral YRS AGO   NEPHROLITHOTOMY Left 07/22/2013   Procedure: NEPHROLITHOTOMY PERCUTANEOUS;  Surgeon: Molli Hazard, MD;  Location: [facility];  Service: Urology;  Laterality: Left;   WRIST GANGLION CYST REMOVED Right 1969    Allergies  Allergen Reactions   Levaquin [Levofloxacin In D5w] Other (See Comments)    INTENSE ASCHILLES TENON PAIN   Social History   Socioeconomic History   Marital status: Married    Spouse name: Not on file   Number of children: Not on file   Years of education: Not on file   Highest education level: Not on file  Occupational History   Not on file  Tobacco Use   Smoking status: Never   Smokeless tobacco: Never  Substance and Sexual Activity   Alcohol use: Yes  Alcohol/week: 7.0 standard drinks    Types: 7 Glasses of wine per week    Comment: GLASS WINE Q NIGHT   Drug use: No   Sexual activity: Not on file  Other Topics Concern   Not on file  Social History Narrative   Not on file   Social Determinants of Health   Financial Resource Strain: Not on file  Food Insecurity: Not on file  Transportation Needs: Not on file  Physical Activity: Not on file  Stress: Not on file  Social Connections: Not on file  Intimate Partner Violence: Not on file    No family history on file.  Ht '5\' 11"'$  (1.803 m)   Wt 162 lb (73.5 kg)   BMI 22.59 kg/m   [facility] Adult Exercise 07/10/2021  Frequency of aerobic exercise (# of days/week) 7   Average time in minutes 45  Frequency of strengthening activities (# of days/week) 0    No flowsheet data found.  Review of Systems: See HPI above.     Objective:  Physical Exam: Gen: NAD, comfortable in exam room  Back: No gross deformity, scoliosis.   No midline or bony TTP. ROM: pain elicited with extension at the waist, FROM of flexion and L/R lateral flexion Strength LEs 5/5 all muscle groups.   2+ MSRs in patellar and achilles tendons, equal bilaterally. Negative SLRs. Sensation intact to light touch bilaterally.  Negative logroll bilateral hips.  Negative fadir and fabers.  Right shoulder: No swelling, ecchymoses.  No gross deformity. No TTP. ROM limited to 120 degrees of abduction in the right arm due to pain. Experiences pain with extension of the R arm but able to complete. FROM of flexion, cross body adduction Positive Hawkins, Neers. Negative Yergasons. Strength 5/5 with empty can  Pain with resisted internal rotation Negative apprehension. NV intact distally.   Left foot: Tarsal bossing medially and more pronation of left foot compared to right.   Assessment & Plan:  1. Spinal Stenosis of the lumbar spine Physical exam findings, description of current symptoms, and previous medical history concerning for spinal stenosis of the lumbar spine.  Hip exam reassuring.  Referral placed to physical therapy so he can learn home exercise program to start following.  Recommended avoiding extension activities.  2.  Rotator cuff impingement of the right shoulder Exam and mechanism of injury concerning for impingement.  Recommended supportive care measures including Tylenol and NSAID use as needed.  Also discussed home exercises that can help strengthen the shoulder.  If his symptoms not improve, we can consider imaging, injection, nitro patches, or PT referral and follow-up.  3. 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 Has done well with arch support in past - scaphoid pads provided.  Follow up in 6 weeks  Marland Kitchen, MD PGY-3  Patient seen and examined with resident.  Agree with his note and findings.

## 2021-07-10 NOTE — Patient Instructions (Signed)
You have spinal stenosis of your lumbar spine. Start physical therapy to learn more extensive home exercise program. Avoid extension activities.  Scaphoid pads in shoes for left side with or without the green insoles - you can order these in bulk from [facility] if you would like.  You have rotator cuff impingement of your right shoulder. Tylenol, aleve only if needed. Subacromial injection may be beneficial to help with pain and to decrease inflammation if pain becomes very severe. Consider physical therapy with transition to home exercise program. Do home exercise program with theraband and scapular stabilization exercises daily 3 sets of 10 once a day. If not improving at follow-up we will consider imaging, injection, physical therapy, and/or nitro patches. Follow up with me in 6 weeks.

## 2021-07-11 ENCOUNTER — Encounter: Payer: Self-pay | Admitting: Family Medicine

## 2021-07-21 ENCOUNTER — Other Ambulatory Visit: Payer: Self-pay

## 2021-07-21 ENCOUNTER — Ambulatory Visit: Payer: Medicare HMO | Attending: Family Medicine | Admitting: Physical Therapy

## 2021-07-21 DIAGNOSIS — M545 Low back pain, unspecified: Secondary | ICD-10-CM

## 2021-07-21 DIAGNOSIS — M48061 Spinal stenosis, lumbar region without neurogenic claudication: Secondary | ICD-10-CM | POA: Insufficient documentation

## 2021-07-21 NOTE — Therapy (Signed)
[facility] [address] [place], [place], [address] Phone: [phone]   Fax:  [phone]   Physical Therapy Evaluation  Patient Details  Name: [name] MRN: [id number] Date of Birth: 02/20/1951 Referring Provider (PT): Hudnall, Shane R, MD  Encounter Date: 07/21/2021   PT End of Session - 07/22/21 0954     Visit Number 1    Number of Visits 1    PT Start Time 1045    PT Stop Time 1130    PT Time Calculation (min) 45 min             Past Medical History:  Diagnosis Date   Arthritis    Cervical disc herniation    C 8   Depression    MILD, NO CURRENT MEDS FOR   DJD (degenerative joint disease)    GERD (gastroesophageal reflux disease)    History of kidney stones OCT 2012   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    Subjective Assessment - 07/22/21 1013     Subjective [name] is a 70 y.o. male who presents to clinic complaint of R shoulder pain, pronation/pes planus of L foot, and bilateral radicular LE pain R>L .  MOI/History of condition:  Pt was performing a kayaking maneuver involving end range R lumbar ext and rotation combined with anterior movement of the R shoulder from a position of ext and partial abduction.  This caused an acute exacerbation of his chronic low back pain/radicular pain as well as R shoulder pain.  Imaging 5 years ago revealed multiple disc bulges with some neural impingement.  Also having some lateral L thigh pain which may have extended below the knee and started roughly 6 months  (before kayaking injury) but has since resolved.  LE thigh pain R > L.  Feels L LE is weaker than R.   Pain location: thighs (radicular pain).  Red flags: denies n/t, bb changes, saddle anesthesia.  48 hour pain intensity:   highest 9/10, current 0/10, best 0/10.  Aggs: Sitting on toilet for an extended period.  R shoulder: 5-6/10 stabbing with OH movement . Vocation/requirements: retired.  Hobbies: plays in orchestra, electrical work, carpenter, swimming, kayaking.  Functional limitations/goals: mitigate dropped arch, strengthening the low back, improve R shoulder.  Assistive device: none.   Falls: denies.    Pertinent History Significant PMH: cervical fusion C6 - C7                [facility] PT Assessment - 07/22/21 0001       Assessment   Medical Diagnosis Referral diagnosis: Spinal stenosis of lumbar region, unspecified whether neurogenic claudication present (M48.061)    Referring Provider (PT) Hudnall, Shane R, MD    Onset Date/Surgical Date 01/19/21    Next MD Visit NA    Prior Therapy Yes, multiple episodes in past      Precautions   Precaution Comments spinal stenosis?, disk bulges lumbar spine, hisotory of cervical fusion        Restrictions   Weight Bearing Restrictions No      Balance Screen   Has the patient fallen in the past 6 months No      Prior Function   Level of Independence Independent      Observation/Other Assessments   Observations mod drop in L arch compared to R    Focus on Therapeutic Outcomes (FOTO)  none taken - single visit      Palpation   Palpation comment deformity of first and second proximal met L foot                        Objective measurements completed on examination: See above findings.             [facility] PT Treatment/Exercise:  Therapeutic Exercise: Marching Bridge x10 Bird Dog x10 Clamshell with Resistance x10 Ankle Inversion with Resistance x10 Seated Toe Towel Scrunches x10 Standing Arch  Lifts/doming - x10     PT Education - 07/22/21 1017     Education Details POC, diagnosis, prognosis, HEP.  Pt educated via explanation, demonstration, and handout (HEP).  Pt confirms understanding verbally.                         Plan - 07/22/21 1015     Clinical Impression Statement Jordan KNOTH is a 70 y.o. male who presents to clinic with signs and sxs consistent with R SAPS, low back pain with nerve root irritation, chronic first and second metatarsal fracture of L foot (possibly lisfranc).  Pt requests HEP for lumbar strengthening, arch strengthening, and R shoulder strengthening.  His daughter is in PT school and provided him some R shoulder exercises.  D/t time constraints no additional shoulder exercises were provided.  I think he is on the right track with these.  HEP exercises were provided for core, hip, and L arch strengthening.  Pt to complete HEP at home with no follow up visits scheduled at pts request.    Stability/Clinical Decision Making Stable/Uncomplicated    PT Frequency One time visit    PT Next Visit Plan NA             Patient will benefit from skilled therapeutic intervention in order to improve the following deficits and impairments:  Pain  Visit Diagnosis: Low back pain, unspecified back pain laterality, unspecified chronicity, unspecified whether sciatica present     Problem List Patient Active Problem List   Diagnosis Date Noted   Lateral epicondylitis of left elbow 11/24/2013   Left anterior knee pain 10/21/2013   Quadriceps weakness 10/21/2013    Mathis Dad, PT 07/22/2021, 10:29 AM  [facility] [address] [place], [place], [address] Phone: [phone]   Fax:  [phone]  Name: JOHNEDWARD Miller MRN: [id number] Date of Birth: 06-Dec-1950

## 2021-07-22 ENCOUNTER — Encounter: Payer: Self-pay | Admitting: Physical Therapy

## 2021-07-22 NOTE — Patient Instructions (Signed)
Access Code: [email or web address] URL: [email or web address] Date: 07/22/2021 Prepared by: Shearon Balo  Exercises Marching Bridge - 1 x daily - 4-7 x weekly - 3 sets - 10 reps Bird Dog - 1 x daily - 4-7 x weekly - 2 sets - 10 reps - 10'' hold Clamshell with Resistance - 1 x daily - 4-7 x weekly - 3 sets - 10 reps - 5'' hold Seated Figure 4 Ankle Inversion with Resistance - 1 x daily - 4- 7 x weekly - 3 sets - 10 reps Seated Toe Towel Scrunches - 1 x daily - 4- 7 x weekly - 2 sets - 10 reps Seated Arch Lifts - 1 x daily - 4- 7 x weekly - 2 sets - 10 reps

## 2021-08-08 DIAGNOSIS — Z0100 Encounter for examination of eyes and vision without abnormal findings: Secondary | ICD-10-CM | POA: Diagnosis not present

## 2021-09-12 DIAGNOSIS — S0990XA Unspecified injury of head, initial encounter: Secondary | ICD-10-CM | POA: Diagnosis not present

## 2021-09-12 DIAGNOSIS — R350 Frequency of micturition: Secondary | ICD-10-CM | POA: Diagnosis not present

## 2021-09-12 DIAGNOSIS — W19XXXD Unspecified fall, subsequent encounter: Secondary | ICD-10-CM | POA: Diagnosis not present

## 2021-09-12 DIAGNOSIS — J018 Other acute sinusitis: Secondary | ICD-10-CM | POA: Diagnosis not present

## 2021-09-12 DIAGNOSIS — Z23 Encounter for immunization: Secondary | ICD-10-CM | POA: Diagnosis not present

## 2021-10-02 DIAGNOSIS — J209 Acute bronchitis, unspecified: Secondary | ICD-10-CM | POA: Diagnosis not present

## 2021-12-01 ENCOUNTER — Ambulatory Visit
Admission: RE | Admit: 2021-12-01 | Discharge: 2021-12-01 | Disposition: A | Discharge status: Home / Self Care | Payer: Medicare HMO | Source: Ambulatory Visit | Attending: Internal Medicine | Admitting: Internal Medicine

## 2021-12-01 ENCOUNTER — Other Ambulatory Visit: Payer: Self-pay | Admitting: Internal Medicine

## 2021-12-01 DIAGNOSIS — H539 Unspecified visual disturbance: Secondary | ICD-10-CM

## 2021-12-01 DIAGNOSIS — R519 Headache, unspecified: Secondary | ICD-10-CM

## 2021-12-01 DIAGNOSIS — R269 Unspecified abnormalities of gait and mobility: Secondary | ICD-10-CM | POA: Diagnosis not present

## 2021-12-05 ENCOUNTER — Encounter: Payer: Self-pay | Admitting: Diagnostic Neuroimaging

## 2021-12-05 ENCOUNTER — Ambulatory Visit: Payer: Medicare HMO | Admitting: Diagnostic Neuroimaging

## 2021-12-05 ENCOUNTER — Encounter: Payer: Self-pay | Admitting: *Deleted

## 2021-12-05 ENCOUNTER — Telehealth: Payer: Self-pay | Admitting: Diagnostic Neuroimaging

## 2021-12-05 ENCOUNTER — Other Ambulatory Visit: Payer: Self-pay | Admitting: *Deleted

## 2021-12-05 ENCOUNTER — Other Ambulatory Visit: Payer: Self-pay

## 2021-12-05 VITALS — BP 142/78 | HR 76 | Ht 71.0 in | Wt 168.0 lb

## 2021-12-05 DIAGNOSIS — H539 Unspecified visual disturbance: Secondary | ICD-10-CM

## 2021-12-05 DIAGNOSIS — R42 Dizziness and giddiness: Secondary | ICD-10-CM

## 2021-12-05 DIAGNOSIS — H5319 Other subjective visual disturbances: Secondary | ICD-10-CM | POA: Diagnosis not present

## 2021-12-05 NOTE — Telephone Encounter (Signed)
[employer] medicare order sent to [facility], They will obtain the auth and reach out to the patient to schedule.

## 2021-12-05 NOTE — Progress Notes (Signed)
[facility]  PATIENT: [name] DOB: 1951-03-07  REFERRING CLINICIAN: Wenda Low, MD HISTORY FROM: patient  REASON FOR VISIT: new consult    HISTORICAL  CHIEF COMPLAINT:  Chief Complaint  Patient presents with   Gait Problem    Rm 6 New Pt, wife- Arbie Cookey    HISTORY OF PRESENT ILLNESS:   71 year old male here for evaluation of vision disturbance, headache, abnormal spell.  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  11/24/2021 patient woke up and had breakfast.  He did not feel well, somewhat tired feeling and went to sit down to do transcendental meditation.  He fell asleep during this.  After about 1 hour he woke up and noticed vertical oscillating movements of his environment.  This lasted in a very intense fashion for about 3 to 4 minutes.  He was having difficulty keeping his eyes open and standing up.  He rested for the next few hours and by 6 PM symptoms started to ease up.  He took some meclizine which seemed to help.  Had some issues with dull headache lasting for a week after the symptoms.  Now symptoms essentially resolved.  History of migraine headaches for many years.  Notes global severe headaches associated with nausea, spinning sensation, visual changes, lasting hours at a time.  These are managed conservatively over the years.  Sometimes caffeine would help these.  Patient has had several concussions in the few months leading up to this.  No upper respiratory infections, sinusitis or illnesses immediately prior to this event.    REVIEW OF SYSTEMS: Full 14 system review of systems performed and negative with exception of: as per HPI.  ALLERGIES: Allergies  Allergen Reactions   Levaquin [Levofloxacin In D5w] Other (See Comments)    INTENSE ASCHILLES TENON PAIN   Paroxetine Hcl     HOME MEDICATIONS: Outpatient Medications Prior to Visit  Medication Sig Dispense Refill   FLUoxetine (PROZAC) 20 MG capsule Take 20 mg by mouth daily.      Multiple Vitamin (MULTIVITAMIN ADULT PO) daily.     Multiple Vitamins-Minerals (LUTEIN-ZEAXANTHIN) TABS 6 mg daily.     Naftifine HCl (NAFTIN) 2 % GEL Apply topically.     Omega-3 Fatty Acids (FISH OIL) 1200 MG CPDR 1000-2000 daily     propranolol (INDERAL) 20 MG tablet Take 20 mg by mouth every 7 (seven) days. TAKES ON Thursday  PM ONLY PRIOR TO PERFORMING FOR STAGE FRIGHT     fluticasone (FLONASE) 50 MCG/ACT nasal spray Place 1 spray into the nose daily.     meclizine (ANTIVERT) 12.5 MG tablet Take 12.5 mg by mouth 2 (two) times daily as needed. (Patient not taking: Reported on 12/05/2021)     No facility-administered medications prior to visit.    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    PAST SURGICAL HISTORY: Past Surgical History:  Procedure Laterality Date   CATARACT EXTRACTION Bilateral    x2   CERVICAL DISC FUSED  15 YRS AGO   C 6 TO C 8  EXCISION NEUROMA     scalp   LASIX EYE SURGERY Bilateral YRS AGO   NEPHROLITHOTOMY Left 07/22/2013   Procedure: NEPHROLITHOTOMY PERCUTANEOUS;  Surgeon: Molli Hazard, MD;  Location: [facility];  Service: Urology;  Laterality: Left;   WRIST GANGLION CYST REMOVED Right 11/13/1967    FAMILY HISTORY: Family History  Problem Relation Age of Onset   Alzheimer's disease Mother    Other Father        car accident   Cancer Brother        lung    SOCIAL HISTORY: Social History   Socioeconomic History   Marital status: Married    Spouse name: Arbie Cookey   Number of children: 2   Years of education: Not on file   Highest education level: Professional school degree (e.g., MD, DDS, DVM, JD)   Occupational History   Not on file  Tobacco Use   Smoking status: Never   Smokeless tobacco: Never  Substance and Sexual Activity   Alcohol use: Not Currently    Comment: rare   Drug use: No   Sexual activity: Not on file  Other Topics Concern   Not on file  Social History Narrative   Lives with wife   Caffeine- yes   Social Determinants of Health   Financial Resource Strain: Not on file  Food Insecurity: Not on file  Transportation Needs: Not on file  Physical Activity: Not on file  Stress: Not on file  Social Connections: Not on file  Intimate Partner Violence: Not on file     PHYSICAL EXAM  GENERAL EXAM/CONSTITUTIONAL: Vitals:  Vitals:   12/05/21 1352  BP: (!) 142/78  Pulse: 76  Weight: 168 lb (76.2 kg)  Height: 5\' 11"  (1.803 m)   Body mass index is 23.43 kg/m. Wt Readings from Last 3 Encounters:  12/05/21 168 lb (76.2 kg)  07/10/21 162 lb (73.5 kg)  02/16/14 172 lb (78 kg)   Patient is in no distress; well developed, nourished and groomed; neck is supple  CARDIOVASCULAR: Examination of carotid arteries is normal; no carotid bruits Regular rate and rhythm, no murmurs Examination of peripheral vascular system by observation and palpation is normal  EYES: Ophthalmoscopic exam of optic discs and posterior segments is normal; no papilledema or hemorrhages No results found.  MUSCULOSKELETAL: Gait, strength, tone, movements noted in Neurologic exam below  NEUROLOGIC: MENTAL STATUS:  No flowsheet data found. awake, alert, oriented to person, place and time recent and remote memory intact normal attention and concentration language fluent, comprehension intact, naming intact fund of knowledge appropriate  CRANIAL NERVE:  2nd - no papilledema on fundoscopic exam 2nd, 3rd, 4th, 6th - pupils equal and reactive to light, visual fields full to confrontation, extraocular muscles intact, no nystagmus 5th - facial sensation symmetric 7th - facial strength  symmetric 8th - hearing intact 9th - palate elevates symmetrically, uvula midline 11th - shoulder shrug symmetric 12th - tongue protrusion midline  MOTOR:  normal bulk and tone, full strength in the BUE, BLE  SENSORY:  normal and symmetric to light touch, temperature, vibration; SLIGHTLY DECR IN LEFT FOOT  COORDINATION:  finger-nose-finger, fine finger movements normal  REFLEXES:  deep tendon reflexes present and symmetric; EXCEPT SLIGHTLY DECR IN LEFT KNEE  GAIT/STATION:  narrow based gait    DIAGNOSTIC DATA (LABS, IMAGING, TESTING) - I reviewed patient records, labs, notes, testing and imaging myself where available.  Lab Results  Component Value Date   WBC 6.8 07/22/2013   HGB 13.9 07/23/2013  HCT 40.1 07/23/2013   MCV 92.6 07/22/2013   PLT 213 07/22/2013      Component Value Date/Time   NA 137 07/23/2013 0433   K 4.2 07/23/2013 0433   CL 106 07/23/2013 0433   CO2 23 07/23/2013 0433   GLUCOSE 125 (H) 07/23/2013 0433   BUN 16 07/23/2013 0433   CREATININE 0.73 07/23/2013 0433   CALCIUM 8.4 07/23/2013 0433   GFRNONAA >90 07/23/2013 0433   GFRAA >90 07/23/2013 0433   No results found for: CHOL, HDL, LDLCALC, LDLDIRECT, TRIG, CHOLHDL No results found for: HGBA1C No results found for: VITAMINB12 No results found for: TSH   05/07/02 MRI brain   1.  FLUID SIGNAL IN A SINGLE RIGHT-SIDED MASTOID AIR CELL, SUGGESTING VERY LOW LEVEL SINUSITIS.  2.  NO SIGNIFICANT INTRACRANIAL ABNORMALITY IS IDENTIFIED.   08/25/03 MRI lumbar spine 1.  FORAMINAL AND EXTRAFORAMINAL DISC PROTRUSION ON THE LEFT AT L4-5 MAY AFFECT THE LEFT L4 NERVE  ROOT.  2.  ANNULAR RENT AND A BROAD-BASED DISC PROTRUSION AT L5-S1.  3.  DEGENERATIVE DISC DISEASE AT L1-2 WITH A BROAD-BASED BILOBED-APPEARING DISC PROTRUSION.   12/01/21 CT head  - Normal head CT.     ASSESSMENT AND PLAN  71 y.o. year old male here with:   Dx:  1. Visual disturbance   2. Vertigo   3. Oscillopsia       PLAN:  ABNORMAL SPELL (oscillopsia, headache, fatigue; ? peripheral vestibulopathy vs vestibular migraine) - check MRI brain w/wo with IAC to rule out other central causes of vestibulopathy  Orders Placed This Encounter  Procedures   MR BRAIN/IAC W WO CONTRAST   Return for pending if symptoms worsen or fail to improve, pending test results.  I reviewed images, labs, notes, records myself. I summarized findings and reviewed with patient, for this high risk condition (vision disturbance, headaches) requiring high complexity decision making.     Penni Bombard, MD 04/18/3709, 6:26 PM Certified in Neurology, Neurophysiology and Neuroimaging  [facility] [address], [place], [place] [address] [phone]

## 2021-12-13 ENCOUNTER — Other Ambulatory Visit: Payer: Self-pay

## 2021-12-13 ENCOUNTER — Ambulatory Visit
Admission: RE | Admit: 2021-12-13 | Discharge: 2021-12-13 | Disposition: A | Discharge status: Home / Self Care | Payer: Medicare HMO | Source: Ambulatory Visit | Attending: Diagnostic Neuroimaging | Admitting: Diagnostic Neuroimaging

## 2021-12-13 DIAGNOSIS — H539 Unspecified visual disturbance: Secondary | ICD-10-CM | POA: Diagnosis not present

## 2021-12-13 DIAGNOSIS — R42 Dizziness and giddiness: Secondary | ICD-10-CM

## 2021-12-13 MED ORDER — GADOBENATE DIMEGLUMINE 529 MG/ML IV SOLN
15.0000 mL | Freq: Once | INTRAVENOUS | Status: AC | PRN
  Administered 2021-12-13: 15 mL via INTRAVENOUS

## 2021-12-14 ENCOUNTER — Telehealth: Payer: Self-pay | Admitting: Diagnostic Neuroimaging

## 2021-12-14 DIAGNOSIS — H52203 Unspecified astigmatism, bilateral: Secondary | ICD-10-CM | POA: Diagnosis not present

## 2021-12-14 DIAGNOSIS — Z961 Presence of intraocular lens: Secondary | ICD-10-CM | POA: Diagnosis not present

## 2021-12-14 DIAGNOSIS — H43813 Vitreous degeneration, bilateral: Secondary | ICD-10-CM | POA: Diagnosis not present

## 2021-12-14 DIAGNOSIS — D352 Benign neoplasm of pituitary gland: Secondary | ICD-10-CM

## 2021-12-14 NOTE — Telephone Encounter (Signed)
I called patient with MRI results.  No major findings to explain patient's transient vertigo and visual disturbance.  Internal auditory canals and structures are unremarkable.  Few nonspecific foci of T2 hyperintensities were noted.  No other major findings.  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  No mass-effect on optic chiasm.  I do not think this is related to patient's vertigo/visual disturbance.  Discussed options for lab testing or further evaluation.  Will refer to endocrinology for further evaluation and possible hormone evaluation.   Orders Placed This Encounter  Procedures   Ambulatory referral to Endocrinology    Penni Bombard, MD 11/15/1028, 13:14 AM Certified in Neurology, Neurophysiology and Neuroimaging  [facility] [address] [place], [place] [address] [phone]

## 2021-12-28 DIAGNOSIS — D352 Benign neoplasm of pituitary gland: Secondary | ICD-10-CM | POA: Diagnosis not present

## 2022-01-02 ENCOUNTER — Ambulatory Visit
Admission: RE | Admit: 2022-01-02 | Discharge: 2022-01-02 | Disposition: A | Discharge status: Home / Self Care | Payer: Medicare HMO | Source: Ambulatory Visit | Attending: Internal Medicine | Admitting: Internal Medicine

## 2022-01-02 ENCOUNTER — Other Ambulatory Visit: Payer: Self-pay | Admitting: Internal Medicine

## 2022-01-02 DIAGNOSIS — I82612 Acute embolism and thrombosis of superficial veins of left upper extremity: Secondary | ICD-10-CM | POA: Diagnosis not present

## 2022-01-02 DIAGNOSIS — I82622 Acute embolism and thrombosis of deep veins of left upper extremity: Secondary | ICD-10-CM | POA: Diagnosis not present

## 2022-01-02 DIAGNOSIS — I808 Phlebitis and thrombophlebitis of other sites: Secondary | ICD-10-CM

## 2022-01-03 ENCOUNTER — Telehealth: Payer: Self-pay | Admitting: Hematology and Oncology

## 2022-01-03 NOTE — Telephone Encounter (Signed)
Scheduled appt per 2/22 referral. Pt is aware of appt date and time. Pt is aware to arrive 15 mins prior to appt time and to bring and updated insurance card. Pt is aware of appt location.   

## 2022-01-04 ENCOUNTER — Other Ambulatory Visit: Payer: Self-pay

## 2022-01-04 ENCOUNTER — Inpatient Hospital Stay: Payer: Medicare HMO | Attending: Hematology and Oncology | Admitting: Hematology and Oncology

## 2022-01-04 ENCOUNTER — Encounter: Payer: Self-pay | Admitting: Hematology and Oncology

## 2022-01-04 DIAGNOSIS — Z79899 Other long term (current) drug therapy: Secondary | ICD-10-CM | POA: Insufficient documentation

## 2022-01-04 DIAGNOSIS — F32A Depression, unspecified: Secondary | ICD-10-CM | POA: Insufficient documentation

## 2022-01-04 DIAGNOSIS — M5020 Other cervical disc displacement, unspecified cervical region: Secondary | ICD-10-CM | POA: Diagnosis not present

## 2022-01-04 DIAGNOSIS — I82622 Acute embolism and thrombosis of deep veins of left upper extremity: Secondary | ICD-10-CM | POA: Diagnosis not present

## 2022-01-04 DIAGNOSIS — M1990 Unspecified osteoarthritis, unspecified site: Secondary | ICD-10-CM | POA: Insufficient documentation

## 2022-01-04 DIAGNOSIS — E7800 Pure hypercholesterolemia, unspecified: Secondary | ICD-10-CM | POA: Insufficient documentation

## 2022-01-04 DIAGNOSIS — Z7901 Long term (current) use of anticoagulants: Secondary | ICD-10-CM | POA: Diagnosis not present

## 2022-01-04 DIAGNOSIS — R69 Illness, unspecified: Secondary | ICD-10-CM | POA: Diagnosis not present

## 2022-01-04 NOTE — Assessment & Plan Note (Signed)
There is no doubt in my mind that the recent DVT was provoked It is not clear to me that this is an acute DVT Acute on chronic DVT is also possible We discussed the importance of the patient modifying his lifestyle and to consider playing his [place] horn in a different way We also discussed the importance of adequate hydration as dehydration will contribute to recurrent DVT For now, I recommend anticoagulation therapy for at least 3 months I plan to bring him back in 3 months to reassess and to do blood work We discussed the risk and benefits of future repeat imaging study I will see him back again for further follow-up

## 2022-01-04 NOTE — Progress Notes (Signed)
[facility] CONSULT NOTE  Patient Care Team: Pa, Owings as PCP - General (Family Medicine)   ASSESSMENT & PLAN:  Deep vein thrombosis (DVT) of brachial vein of left upper extremity ([place]) There is no doubt in my mind that the recent DVT was provoked It is not clear to me that this is an acute DVT Acute on chronic DVT is also possible We discussed the importance of the patient modifying his lifestyle and to consider playing his [place] horn in a different way We also discussed the importance of adequate hydration as dehydration will contribute to recurrent DVT For now, I recommend anticoagulation therapy for at least 3 months I plan to bring him back in 3 months to reassess and to do blood work We discussed the risk and benefits of future repeat imaging study I will see him back again for further follow-up   Orders Placed This Encounter  Procedures   [facility] Only    Standing Status:   Future    Standing Expiration Date:   01/04/2023   CBC with Differential ([facility] Only)    Standing Status:   Future    Standing Expiration Date:   01/04/2023   D-dimer, quantitative    Standing Status:   Future    Standing Expiration Date:   01/04/2023    All questions were answered. The patient knows to call the clinic with any problems, questions or concerns. The total time spent in the appointment was 55 minutes encounter with patients including review of chart and various tests results, discussions about plan of care and coordination of care plan  Heath Lark, MD 2/23/20235:19 PM  CHIEF COMPLAINTS/PURPOSE OF CONSULTATION:  Left upper extremity DVT  HISTORY OF PRESENTING ILLNESS:  [name] 71 y.o. 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  This is approximately in the year of 2014 When I reviewed the sports medicine visit note in 2015, he was noted to have discomfort/tennis elbow diagnosis on the left upper extremity Most recently, he palpated a cord on the left basilic vein territory several days ago that developed into acute achiness He underwent ultrasound venous Doppler of the upper extremity on 01/02/2022 which showed  Positive for occlusive thrombus within the left basilic vein from the wrist to the brachial junction. Nonocclusive thrombus extends into the brachial vein.  He was started on anticoagulation therapy He denies bleeding complications so far He denies recent chest pain on exertion, shortness of breath on minimal exertion, pre-syncopal episodes, hemoptysis, or palpitation. He denies recent history of trauma, long distance travel, dehydration, recent surgery, smoking or prolonged immobilization. On review of his oral fluid intake, he drinks approximately 40 to 50 ounces of water per day He had no prior history or diagnosis of cancer. His age appropriate screening programs are up-to-date. He had prior surgeries before and never had perioperative thromboembolic events. The patient is not on testosterone replacement therapy and never had thrombotic events. There is no family history of blood clots or miscarriages.  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    SURGICAL HISTORY: Past Surgical History:  Procedure Laterality Date   CATARACT EXTRACTION Bilateral    x2   CERVICAL DISC FUSED  15 YRS AGO   C  6 TO C 8   EXCISION NEUROMA     scalp   LASIX EYE SURGERY Bilateral YRS AGO   NEPHROLITHOTOMY Left 07/22/2013   Procedure: NEPHROLITHOTOMY PERCUTANEOUS;  Surgeon: Molli Hazard, MD;  Location: [facility];  Service: Urology;  Laterality: Left;   WRIST GANGLION CYST REMOVED Right 11/13/1967    SOCIAL HISTORY: Social History   Socioeconomic History   Marital status: Married    Spouse name: Arbie Cookey   Number of children: 2   Years of education: Not on file   Highest education level: Professional school degree (e.g., MD, DDS, DVM, JD)  Occupational History   Not on file  Tobacco Use   Smoking status: Never   Smokeless tobacco: Never  Substance and Sexual Activity   Alcohol use: Not Currently    Comment: rare   Drug use: No   Sexual activity: Not on file  Other Topics Concern   Not on file  Social History Narrative   Lives with wife   Caffeine- yes   Social Determinants of Health   Financial Resource Strain: Not on file  Food Insecurity: Not on file  Transportation Needs: Not on file  Physical Activity: Not on file  Stress: Not on file  Social Connections: Not on file  Intimate Partner Violence: Not on file    FAMILY HISTORY: Family History  Problem Relation Age of Onset   Alzheimer's disease Mother    Other Father        car accident   Cancer Brother        lung    ALLERGIES:  is allergic to levaquin [levofloxacin in d5w] and paroxetine hcl.  MEDICATIONS:  Current Outpatient Medications  Medication Sig Dispense Refill   APIXABAN (ELIQUIS) VTE STARTER PACK (10MG  AND 5MG ) Take 5 mg by mouth as directed. Take as directed on package: start with two-5mg  tablets twice daily for 7 days. On day 8, switch to one-5mg  tablet twice daily.     FLUoxetine (PROZAC) 20 MG capsule Take 20 mg by mouth daily.     Multiple Vitamin (MULTIVITAMIN ADULT PO) daily.     Multiple Vitamins-Minerals (LUTEIN-ZEAXANTHIN) TABS 6 mg daily.     Naftifine HCl (NAFTIN) 2 % GEL Apply  topically.     Omega-3 Fatty Acids (FISH OIL) 1200 MG CPDR 1000-2000 daily     propranolol (INDERAL) 20 MG tablet Take 20 mg by mouth every 7 (seven) days. TAKES ON Thursday  PM ONLY PRIOR TO PERFORMING FOR STAGE FRIGHT     No current facility-administered medications for this visit.    REVIEW OF SYSTEMS:   Constitutional: Denies fevers, chills or abnormal night sweats Eyes: Denies blurriness of vision, double vision or watery eyes Ears, nose, mouth, throat, and face: Denies mucositis or sore throat Respiratory: Denies cough, dyspnea or wheezes Cardiovascular: Denies palpitation, chest discomfort or lower extremity swelling Gastrointestinal:  Denies nausea, heartburn or change in bowel habits Skin: Denies abnormal skin rashes Lymphatics: Denies new lymphadenopathy or easy bruising  Neurological:Denies numbness, tingling or new weaknesses Behavioral/Psych: Mood is stable, no new changes  All other systems were reviewed with the patient and are negative.  PHYSICAL EXAMINATION: ECOG PERFORMANCE STATUS: 0 - Asymptomatic  Vitals:   01/04/22 1305  BP: 130/65  Pulse: 72  Resp: 18  Temp: 98.2 F (36.8 C)  SpO2: 100%   Filed Weights   01/04/22 1305  Weight: 167 lb 3.2 oz (75.8 kg)    GENERAL:alert, no distress and comfortable SKIN: skin color, texture, turgor are normal, no rashes or significant lesions.  Palpable cordlike thickness on the left upper extremity EYES: normal, conjunctiva are pink and non-injected, sclera clear OROPHARYNX:no exudate, no erythema and lips, buccal mucosa, and tongue normal  NECK: supple, thyroid normal size, non-tender, without nodularity LYMPH:  no palpable lymphadenopathy in the cervical, axillary or inguinal LUNGS: clear to auscultation and percussion with normal breathing effort HEART: regular rate & rhythm and no murmurs and no lower extremity edema ABDOMEN:abdomen soft, non-tender and normal bowel sounds Musculoskeletal:no cyanosis of digits and  no clubbing  PSYCH: alert & oriented x 3 with fluent speech NEURO: no focal motor/sensory deficits  LABORATORY DATA:  I have reviewed the data as listed Lab Results  Component Value Date   WBC 6.8 07/22/2013   HGB 13.9 07/23/2013   HCT 40.1 07/23/2013   MCV 92.6 07/22/2013   PLT 213 07/22/2013    RADIOGRAPHIC STUDIES: I have personally reviewed the radiological images as listed and agreed with the findings in the report. US Venous Img Upper Uni Left (DVT)  Result Date: 01/02/2022 CLINICAL DATA:  Thrombophlebitis arm EXAM: LEFT UPPER EXTREMITY VENOUS DOPPLER ULTRASOUND TECHNIQUE: Gray-scale sonography with graded compression, as well as color Doppler and duplex ultrasound were performed to evaluate the upper extremity deep venous system from the level of the subclavian vein and including the jugular, axillary, basilic, radial, ulnar and upper cephalic vein. Spectral Doppler was utilized to evaluate flow at rest and with distal augmentation maneuvers. COMPARISON:  None. FINDINGS: Contralateral Subclavian Vein: Respiratory phasicity is normal and symmetric with the symptomatic side. No evidence of thrombus. Normal compressibility. Internal Jugular Vein: No evidence of thrombus. Normal compressibility, respiratory phasicity and response to augmentation. Subclavian Vein: No evidence of thrombus. Normal compressibility, respiratory phasicity and response to augmentation. Axillary Vein: No evidence of thrombus. Normal compressibility, respiratory phasicity and response to augmentation. Cephalic Vein: No evidence of thrombus. Normal compressibility, respiratory phasicity and response to augmentation. Basilic Vein: Occlusive thrombus is visible. Limited compressibility. Thrombus extends from the wrist to the brachial junction. Brachial Veins: Nonocclusive thrombus extends into the brachial vein. Radial Veins: No evidence of thrombus. Normal compressibility, respiratory phasicity and response to  augmentation. Ulnar Veins: No evidence of thrombus. Normal compressibility, respiratory phasicity and response to augmentation. IMPRESSION: Positive for occlusive thrombus within the left basilic vein from the wrist to the brachial junction. Nonocclusive thrombus extends into the brachial vein. These results will be called to the ordering clinician or representative by the Radiologist Assistant, and communication documented in the PACS or [employer]. Electronically Signed   By: Margaretha Sheffield M.D.   On: 01/02/2022 14:24   MR BRAIN/IAC W WO CONTRAST  Result Date: 12/14/2021 [facility] NEUR[facility] REPORT STUDY DATE: 12/13/21 PATIENT NAME: PAULINE TRAINER DOB: February 19, 1951 MRN: [id number] ORDERING CLINICIAN: Penni Bombard, MD CLINICAL HISTORY: 71 year old male with visual disturbance of vertigo. EXAM: MR BRAIN/IAC W WO CONTRAST TECHNIQUE: MRI of the brain with and without contrast was obtained utilizing 5 mm  axial slices with T1, T2, T2 flair, SWI and diffusion weighted views.  T1 sagittal, T2 coronal and postcontrast views in the axial and coronal plane were obtained. CONTRAST: 49ml multihance COMPARISON: 12/01/21 CT IMAGING SITE: [facility] [address] [place] FINDINGS: No abnormal lesions are seen on diffusion-weighted views to suggest acute ischemia. The cortical sulci, fissures and cisterns are normal in size and appearance. Lateral, third and fourth ventricle are normal in size and appearance. No extra-axial fluid collections are seen. No evidence of mass effect or midline shift.  Few scattered periventricular subcortical foci of nonspecific T2 hyperintensities.  No abnormal lesions are seen on post contrast views.  Thin cut views of internal auditory canals, semicircular canals, CN 7/8 complexes and brainstem structures are unremarkable. No abnormal compressive or inflammatory lesions.  On sagittal views the posterior fossa, pituitary gland  and corpus callosum are notable for hypoenhancing cystic pituitary lesion measuring 7 x 7 mm, slightly towards the right side.  Pituitary stalk is slightly deviated to the left side.  No contact upon the overlying optic chiasm. No evidence of intracranial hemorrhage on SWI views. The orbits and their contents, paranasal sinuses and calvarium are unremarkable.  Bilateral lens extractions.  Intracranial flow voids are present.   MRI brain (with and without) demonstrating: - Thin cut views of the internal auditory canals, semicircular canals, CN 7 / 8 complexes and brainstem structures are unremarkable. - Few scattered periventricular subcortical foci of nonspecific gliosis.  No abnormal lesions are seen on post contrast views.  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 No contact upon the overlying optic chiasm. INTERPRETING PHYSICIAN: Penni Bombard, MD Certified in Neurology, Neurophysiology and Neuroimaging [facility] [address] [place], [place] [address] [phone]

## 2022-01-12 ENCOUNTER — Encounter: Payer: Self-pay | Admitting: Hematology and Oncology

## 2022-01-15 ENCOUNTER — Telehealth: Payer: Self-pay | Admitting: Hematology and Oncology

## 2022-01-15 NOTE — Telephone Encounter (Signed)
R/s per 3/2 in basket, pt has been called and confirmed  ?

## 2022-01-16 ENCOUNTER — Encounter: Payer: Self-pay | Admitting: Hematology and Oncology

## 2022-01-23 ENCOUNTER — Encounter: Payer: Self-pay | Admitting: Diagnostic Neuroimaging

## 2022-02-02 DIAGNOSIS — Z0000 Encounter for general adult medical examination without abnormal findings: Secondary | ICD-10-CM | POA: Diagnosis not present

## 2022-02-02 DIAGNOSIS — R253 Fasciculation: Secondary | ICD-10-CM | POA: Diagnosis not present

## 2022-02-02 DIAGNOSIS — Z1389 Encounter for screening for other disorder: Secondary | ICD-10-CM | POA: Diagnosis not present

## 2022-02-02 DIAGNOSIS — R69 Illness, unspecified: Secondary | ICD-10-CM | POA: Diagnosis not present

## 2022-02-02 DIAGNOSIS — E7800 Pure hypercholesterolemia, unspecified: Secondary | ICD-10-CM | POA: Diagnosis not present

## 2022-02-02 DIAGNOSIS — Z125 Encounter for screening for malignant neoplasm of prostate: Secondary | ICD-10-CM | POA: Diagnosis not present

## 2022-02-02 DIAGNOSIS — I82409 Acute embolism and thrombosis of unspecified deep veins of unspecified lower extremity: Secondary | ICD-10-CM | POA: Diagnosis not present

## 2022-02-03 ENCOUNTER — Encounter: Payer: Self-pay | Admitting: Hematology and Oncology

## 2022-02-05 ENCOUNTER — Encounter: Payer: Self-pay | Admitting: Family Medicine

## 2022-02-07 DIAGNOSIS — M6289 Other specified disorders of muscle: Secondary | ICD-10-CM | POA: Diagnosis not present

## 2022-02-07 DIAGNOSIS — R5383 Other fatigue: Secondary | ICD-10-CM | POA: Diagnosis not present

## 2022-02-07 DIAGNOSIS — M791 Myalgia, unspecified site: Secondary | ICD-10-CM | POA: Diagnosis not present

## 2022-02-07 DIAGNOSIS — D497 Neoplasm of unspecified behavior of endocrine glands and other parts of nervous system: Secondary | ICD-10-CM | POA: Diagnosis not present

## 2022-02-07 DIAGNOSIS — R252 Cramp and spasm: Secondary | ICD-10-CM | POA: Diagnosis not present

## 2022-02-07 DIAGNOSIS — Z87442 Personal history of urinary calculi: Secondary | ICD-10-CM | POA: Diagnosis not present

## 2022-02-08 DIAGNOSIS — D497 Neoplasm of unspecified behavior of endocrine glands and other parts of nervous system: Secondary | ICD-10-CM | POA: Diagnosis not present

## 2022-02-13 DIAGNOSIS — D497 Neoplasm of unspecified behavior of endocrine glands and other parts of nervous system: Secondary | ICD-10-CM | POA: Diagnosis not present

## 2022-02-14 DIAGNOSIS — Z1283 Encounter for screening for malignant neoplasm of skin: Secondary | ICD-10-CM | POA: Diagnosis not present

## 2022-02-14 DIAGNOSIS — X32XXXD Exposure to sunlight, subsequent encounter: Secondary | ICD-10-CM | POA: Diagnosis not present

## 2022-02-14 DIAGNOSIS — D485 Neoplasm of uncertain behavior of skin: Secondary | ICD-10-CM | POA: Diagnosis not present

## 2022-02-14 DIAGNOSIS — D225 Melanocytic nevi of trunk: Secondary | ICD-10-CM | POA: Diagnosis not present

## 2022-02-14 DIAGNOSIS — L570 Actinic keratosis: Secondary | ICD-10-CM | POA: Diagnosis not present

## 2022-03-02 ENCOUNTER — Encounter: Payer: Self-pay | Admitting: Hematology and Oncology

## 2022-03-02 ENCOUNTER — Telehealth: Payer: Self-pay

## 2022-03-02 NOTE — Telephone Encounter (Signed)
Called and left a message with family member asking him to call the office back regarding requested lab from Dr. Buddy Duty. ?

## 2022-03-08 ENCOUNTER — Other Ambulatory Visit: Payer: Self-pay | Admitting: Hematology and Oncology

## 2022-03-08 DIAGNOSIS — D352 Benign neoplasm of pituitary gland: Secondary | ICD-10-CM | POA: Insufficient documentation

## 2022-03-28 DIAGNOSIS — Z0100 Encounter for examination of eyes and vision without abnormal findings: Secondary | ICD-10-CM | POA: Diagnosis not present

## 2022-03-29 ENCOUNTER — Other Ambulatory Visit: Payer: Self-pay

## 2022-03-29 ENCOUNTER — Inpatient Hospital Stay: Payer: Medicare HMO | Attending: Hematology and Oncology

## 2022-03-29 DIAGNOSIS — I82622 Acute embolism and thrombosis of deep veins of left upper extremity: Secondary | ICD-10-CM | POA: Insufficient documentation

## 2022-03-29 DIAGNOSIS — Z7901 Long term (current) use of anticoagulants: Secondary | ICD-10-CM | POA: Diagnosis not present

## 2022-03-29 DIAGNOSIS — D352 Benign neoplasm of pituitary gland: Secondary | ICD-10-CM

## 2022-03-29 LAB — CBC WITH DIFFERENTIAL (CANCER CENTER ONLY)
Abs Immature Granulocytes: 0.01 10*3/uL (ref 0.00–0.07)
Basophils Absolute: 0.1 10*3/uL (ref 0.0–0.1)
Basophils Relative: 2 %
Eosinophils Absolute: 0.3 10*3/uL (ref 0.0–0.5)
Eosinophils Relative: 7 %
HCT: 42.8 % (ref 39.0–52.0)
Hemoglobin: 14.8 g/dL (ref 13.0–17.0)
Immature Granulocytes: 0 %
Lymphocytes Relative: 34 %
Lymphs Abs: 1.7 10*3/uL (ref 0.7–4.0)
MCH: 32.9 pg (ref 26.0–34.0)
MCHC: 34.6 g/dL (ref 30.0–36.0)
MCV: 95.1 fL (ref 80.0–100.0)
Monocytes Absolute: 0.4 10*3/uL (ref 0.1–1.0)
Monocytes Relative: 9 %
Neutro Abs: 2.3 10*3/uL (ref 1.7–7.7)
Neutrophils Relative %: 48 %
Platelet Count: 190 10*3/uL (ref 150–400)
RBC: 4.5 MIL/uL (ref 4.22–5.81)
RDW: 12.1 % (ref 11.5–15.5)
WBC Count: 4.8 10*3/uL (ref 4.0–10.5)
nRBC: 0 % (ref 0.0–0.2)

## 2022-03-29 LAB — BASIC METABOLIC PANEL - CANCER CENTER ONLY
Anion gap: 4 — ABNORMAL LOW (ref 5–15)
BUN: 24 mg/dL — ABNORMAL HIGH (ref 8–23)
CO2: 32 mmol/L (ref 22–32)
Calcium: 9.8 mg/dL (ref 8.9–10.3)
Chloride: 104 mmol/L (ref 98–111)
Creatinine: 0.86 mg/dL (ref 0.61–1.24)
GFR, Estimated: >60 mL/min (ref >60)
Glucose, Bld: 85 mg/dL (ref 70–99)
Potassium: 4.3 mmol/L (ref 3.5–5.1)
Sodium: 140 mmol/L (ref 135–145)

## 2022-03-29 LAB — D-DIMER, QUANTITATIVE: D-Dimer, Quant: <0.27 ug/mL-FEU (ref 0.00–0.50)

## 2022-03-30 ENCOUNTER — Telehealth: Payer: Self-pay

## 2022-03-30 ENCOUNTER — Encounter: Payer: Self-pay | Admitting: Hematology and Oncology

## 2022-03-30 ENCOUNTER — Inpatient Hospital Stay: Payer: Medicare HMO | Admitting: Hematology and Oncology

## 2022-03-30 DIAGNOSIS — Z7901 Long term (current) use of anticoagulants: Secondary | ICD-10-CM | POA: Diagnosis not present

## 2022-03-30 DIAGNOSIS — I82622 Acute embolism and thrombosis of deep veins of left upper extremity: Secondary | ICD-10-CM

## 2022-03-30 LAB — PROLACTIN: Prolactin: 1.1 ng/mL — ABNORMAL LOW (ref 4.0–15.2)

## 2022-03-30 NOTE — Telephone Encounter (Signed)
-----   Message from Heath Lark, MD sent at 03/30/2022  3:14 PM EDT ----- Pls tell him prolactin result is available

## 2022-03-30 NOTE — Telephone Encounter (Signed)
Called and left below message. Ask him to call the office for questions. 

## 2022-03-30 NOTE — Assessment & Plan Note (Signed)
I have reviewed recent blood test results D-dimer was negative We discussed the high negative predictive value of D-dimer I do not plan to repeat imaging study of the left upper extremity as it would not change treatment After completion of his current prescription, I recommend the patient switch to 81 mg aspirin for secondary prevention We discussed risk factors that could predispose him to recurrent blood clot again; in this situation, it is mainly with position of his [place] horn when he practices We discussed frequent movement, aspirin therapy and adequate hydration as important strategies to reduce the risk of recurrent blood clot He does not need long-term follow-up with me; I will see him again in the future for perioperative management should he need to undergo major surgeries in the future

## 2022-03-30 NOTE — Progress Notes (Signed)
[facility] OFFICE PROGRESS NOTE  Pa, [facility]  ASSESSMENT & PLAN:  Deep vein thrombosis (DVT) of brachial vein of left upper extremity ([place]) I have reviewed recent blood test results D-dimer was negative We discussed the high negative predictive value of D-dimer I do not plan to repeat imaging study of the left upper extremity as it would not change treatment After completion of his current prescription, I recommend the patient switch to 81 mg aspirin for secondary prevention We discussed risk factors that could predispose him to recurrent blood clot again; in this situation, it is mainly with position of his [place] horn when he practices We discussed frequent movement, aspirin therapy and adequate hydration as important strategies to reduce the risk of recurrent blood clot He does not need long-term follow-up with me; I will see him again in the future for perioperative management should he need to undergo major surgeries in the future   No orders of the defined types were placed in this encounter.   The total time spent in the appointment was 20 minutes encounter with patients including review of chart and various tests results, discussions about plan of care and coordination of care plan   All questions were answered. The patient knows to call the clinic with any problems, questions or concerns. No barriers to learning was detected.    Heath Lark, MD 5/19/20239:38 AM  INTERVAL HISTORY: [name] 71 y.o. male returns for follow-up for history of left upper extremity DVT predisposed by likely repetitive trauma from the positioning of his arm while playing [place] horn He tolerated anticoagulation therapy well without any major bleeding complications  SUMMARY OF HEMATOLOGIC HISTORY: [name] 71 y.o. 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  This is approximately in the year of 2014 When I reviewed the sports medicine visit note in 2015, he was noted to have discomfort/tennis elbow diagnosis on the left upper extremity Most recently, he palpated a cord on the left basilic vein territory several days ago that developed into acute achiness He underwent ultrasound venous Doppler of the upper extremity on 01/02/2022 which showed  Positive for occlusive thrombus within the left basilic vein from the wrist to the brachial junction. Nonocclusive thrombus extends into the brachial vein.  He was started on anticoagulation therapy He denies bleeding complications so far He denies recent chest pain on exertion, shortness of breath on minimal exertion, pre-syncopal episodes, hemoptysis, or palpitation. He denies recent history of trauma, long distance travel, dehydration, recent surgery, smoking or prolonged immobilization. On review of his oral fluid intake, he drinks approximately 40 to 50 ounces of water per day He had no prior history or diagnosis of cancer. His age appropriate screening programs are up-to-date. He had prior surgeries before and never had perioperative thromboembolic events. The patient is not on testosterone replacement therapy and never had thrombotic events. There is no family history of blood clots or miscarriages.  I have reviewed the past medical history, past surgical history, social history and family history with the patient and they are unchanged from previous note.  ALLERGIES:  is allergic to levaquin [levofloxacin in d5w] and paroxetine hcl.  MEDICATIONS:  Current Outpatient Medications  Medication Sig Dispense Refill  FLUoxetine (PROZAC) 20 MG capsule Take 20 mg by mouth daily.     Multiple Vitamin (MULTIVITAMIN ADULT PO) daily.      Multiple Vitamins-Minerals (LUTEIN-ZEAXANTHIN) TABS 6 mg daily.     Naftifine HCl (NAFTIN) 2 % GEL Apply topically.     Omega-3 Fatty Acids (FISH OIL) 1200 MG CPDR 1000-2000 daily     propranolol (INDERAL) 20 MG tablet Take 20 mg by mouth every 7 (seven) days. TAKES ON Thursday  PM ONLY PRIOR TO PERFORMING FOR STAGE FRIGHT     No current facility-administered medications for this visit.     REVIEW OF SYSTEMS:   Constitutional: Denies fevers, chills or night sweats Eyes: Denies blurriness of vision Ears, nose, mouth, throat, and face: Denies mucositis or sore throat Respiratory: Denies cough, dyspnea or wheezes Cardiovascular: Denies palpitation, chest discomfort or lower extremity swelling Gastrointestinal:  Denies nausea, heartburn or change in bowel habits Skin: Denies abnormal skin rashes Lymphatics: Denies new lymphadenopathy or easy bruising Neurological:Denies numbness, tingling or new weaknesses Behavioral/Psych: Mood is stable, no new changes  All other systems were reviewed with the patient and are negative.  PHYSICAL EXAMINATION: ECOG PERFORMANCE STATUS: 0 - Asymptomatic  Vitals:   03/30/22 0807  BP: 118/69  Pulse: 63  Resp: 18  Temp: 97.8 F (36.6 C)  SpO2: 100%   Filed Weights   03/30/22 0807  Weight: 166 lb (75.3 kg)    GENERAL:alert, no distress and comfortable SKIN: skin color, texture, turgor are normal, no rashes or significant lesions EYES: normal, Conjunctiva are pink and non-injected, sclera clear OROPHARYNX:no exudate, no erythema and lips, buccal mucosa, and tongue normal  NECK: supple, thyroid normal size, non-tender, without nodularity LYMPH:  no palpable lymphadenopathy in the cervical, axillary or inguinal LUNGS: clear to auscultation and percussion with normal breathing effort HEART: regular rate & rhythm and no murmurs and no lower extremity edema ABDOMEN:abdomen soft, non-tender and normal bowel sounds Musculoskeletal:no cyanosis of  digits and no clubbing  NEURO: alert & oriented x 3 with fluent speech, no focal motor/sensory deficits  LABORATORY DATA:  I have reviewed the data as listed     Component Value Date/Time   NA 140 03/29/2022 0819   K 4.3 03/29/2022 0819   CL 104 03/29/2022 0819   CO2 32 03/29/2022 0819   GLUCOSE 85 03/29/2022 0819   BUN 24 (H) 03/29/2022 0819   CREATININE 0.86 03/29/2022 0819   CALCIUM 9.8 03/29/2022 0819   GFRNONAA >60 03/29/2022 0819   GFRAA >90 07/23/2013 0433    No results found for: SPEP, UPEP  Lab Results  Component Value Date   WBC 4.8 03/29/2022   NEUTROABS 2.3 03/29/2022   HGB 14.8 03/29/2022   HCT 42.8 03/29/2022   MCV 95.1 03/29/2022   PLT 190 03/29/2022      Chemistry      Component Value Date/Time   NA 140 03/29/2022 0819   K 4.3 03/29/2022 0819   CL 104 03/29/2022 0819   CO2 32 03/29/2022 0819   BUN 24 (H) 03/29/2022 0819   CREATININE 0.86 03/29/2022 0819      Component Value Date/Time   CALCIUM 9.8 03/29/2022 0819

## 2022-04-02 ENCOUNTER — Other Ambulatory Visit: Payer: Medicare HMO

## 2022-04-02 ENCOUNTER — Ambulatory Visit: Payer: Medicare HMO | Admitting: Hematology and Oncology

## 2022-04-03 ENCOUNTER — Ambulatory Visit: Payer: Medicare HMO | Admitting: Hematology and Oncology

## 2022-04-12 ENCOUNTER — Encounter: Payer: Self-pay | Admitting: Hematology and Oncology

## 2022-04-16 ENCOUNTER — Ambulatory Visit: Payer: Medicare HMO | Admitting: Family Medicine

## 2022-04-16 ENCOUNTER — Ambulatory Visit
Admission: RE | Admit: 2022-04-16 | Discharge: 2022-04-16 | Disposition: A | Discharge status: Home / Self Care | Payer: Medicare HMO | Source: Ambulatory Visit | Attending: Family Medicine | Admitting: Family Medicine

## 2022-04-16 VITALS — BP 120/72 | Ht 71.0 in | Wt 163.0 lb

## 2022-04-16 DIAGNOSIS — M25512 Pain in left shoulder: Secondary | ICD-10-CM

## 2022-04-16 DIAGNOSIS — M48061 Spinal stenosis, lumbar region without neurogenic claudication: Secondary | ICD-10-CM | POA: Diagnosis not present

## 2022-04-16 DIAGNOSIS — M79672 Pain in left foot: Secondary | ICD-10-CM

## 2022-04-16 MED ORDER — METHYLPREDNISOLONE ACETATE 40 MG/ML IJ SUSP
40.0000 mg | Freq: Once | INTRAMUSCULAR | Status: AC
  Administered 2022-04-16: 40 mg via INTRA_ARTICULAR

## 2022-04-16 NOTE — Patient Instructions (Signed)
We will go ahead with an MRI of your lumbar spine to assess for spinal stenosis. I will call you with the results and next steps. Continue the medication for restless leg syndrome.  You have rotator cuff impingement of your left shoulder. Try to avoid painful activities (overhead activities, lifting with extended arm) as much as possible. Tylenol as needed. Subacromial injection may be beneficial to help with pain and to decrease inflammation - you were given this today. Consider physical therapy with transition to home exercise program. Do home exercise program with theraband and scapular stabilization exercises daily 3 sets of 10 once a day. If not improving at follow-up we will consider further imaging, physical therapy, and/or nitro patches. Follow up with me in 1 month.

## 2022-04-17 ENCOUNTER — Encounter: Payer: Self-pay | Admitting: Family Medicine

## 2022-04-17 NOTE — Progress Notes (Signed)
PCP: [facility]  Subjective:   HPI: Patient is a 71 y.o. male here for left shoulder pain.  Patient is an avid Probation officer. He has noticed slow progression of pain and strength loss in his left shoulder over past several weeks. Pain worse up to 9/10 level especially when lying on his side. No numbness/tingling.  Also reporting recent diagnosis with possible restless leg syndrome - feeling like he has to move both lower extremities but this occurs not just at nighttime. Seems worse with an increase or decrease in activity. Notes decreased strength in both lower extremities. Problem worse with driving, better with squatting with back flexed. Associated pain into both legs. No numbness.  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     Multiple Vitamin (MULTIVITAMIN ADULT PO) daily.     Multiple Vitamins-Minerals (LUTEIN-ZEAXANTHIN) TABS 6 mg daily.     Naftifine HCl (NAFTIN) 2 % GEL Apply topically.     Omega-3 Fatty Acids (FISH OIL) 1200 MG CPDR 1000-2000 daily     propranolol (INDERAL) 20 MG tablet Take 20 mg by mouth every 7 (seven) days. TAKES ON Thursday  PM ONLY PRIOR TO PERFORMING FOR STAGE FRIGHT     rOPINIRole (REQUIP) 0.5 MG tablet Take 0.5 mg by mouth daily.     No current facility-administered medications on file prior to visit.    Past Surgical History:  Procedure Laterality Date   CATARACT EXTRACTION Bilateral    x2   CERVICAL DISC FUSED  15 YRS AGO   C 6 TO C 8   EXCISION  NEUROMA     scalp   LASIX EYE SURGERY Bilateral YRS AGO   NEPHROLITHOTOMY Left 07/22/2013   Procedure: NEPHROLITHOTOMY PERCUTANEOUS;  Surgeon: Molli Hazard, MD;  Location: [facility];  Service: Urology;  Laterality: Left;   WRIST GANGLION CYST REMOVED Right 11/13/1967    Allergies  Allergen Reactions   Levaquin [Levofloxacin In D5w] Other (See Comments)    INTENSE ASCHILLES TENON PAIN   Paroxetine Hcl     BP 120/72   Ht '5\' 11"'$  (1.803 m)   Wt 163 lb (73.9 kg)   BMI 22.73 kg/m      07/10/2021   11:04 AM  [place] Adult Exercise  Frequency of aerobic exercise (# of days/week) 7  Average time in minutes 45  Frequency of strengthening activities (# of days/week) 0        View : No data to display.              Objective:  Physical Exam:  Gen: NAD, comfortable in exam room  Left shoulder: No swelling, ecchymoses.  No gross deformity. No TTP AC joint, biceps tendon. FROM with painful arc. Positive Hawkins, Neers. Negative Yergasons. Strength 5/5 with empty can and resisted internal/external rotation.  Mild pain empty can. Negative apprehension.  NV intact distally.  Back: No gross deformity, scoliosis. No paraspinal TTP.  No midline or bony TTP. Strength LEs 5/5 all muscle groups.   2+ MSRs in patellar and achilles tendons, equal bilaterally. Negative SLRs. Sensation intact to light touch bilaterally.   Assessment & Plan:  1. Left shoulder pain - 2/2 rotator cuff impingement.  Home exercises reviewed.  Subacromial injection given as well.  Consider formal physical therapy, nitro patches, full ultrasound or MRI if not improving as expected.  F/u in 1 month.  After informed written consent timeout was performed, patient was seated in chair in exam room. Left shoulder was prepped with alcohol swab and utilizing lateral approach with ultrasound guidance, patient's left subacromial space was injected with 3:1 lidocaine: depomedrol. Patient  tolerated the procedure well without immediate complications.  2. Bilateral leg weakness - gross strength ok on exam but noting fatigue, decrease in endurance.  Gets discomfort in low back that is better with flexion, worse with prolonged sitting.  Concern for spinal stenosis of lumbar spine.  Will go ahead with MRI to assess.  Of note at end of visit he reported having pain left foot around the midfoot that has been worse over past year.  Has history of fracture with possible incomplete healing.  Brief MSK u/s noted significant spurring TMT joint about 3rd TMT.  WIll obtain x-rays.

## 2022-04-18 ENCOUNTER — Encounter: Payer: Self-pay | Admitting: Family Medicine

## 2022-04-19 ENCOUNTER — Other Ambulatory Visit: Payer: Medicare HMO

## 2022-04-20 ENCOUNTER — Other Ambulatory Visit: Payer: Self-pay | Admitting: *Deleted

## 2022-04-20 ENCOUNTER — Encounter: Payer: Self-pay | Admitting: Physical Therapy

## 2022-04-20 ENCOUNTER — Encounter: Payer: Self-pay | Admitting: Family Medicine

## 2022-04-20 DIAGNOSIS — M48061 Spinal stenosis, lumbar region without neurogenic claudication: Secondary | ICD-10-CM

## 2022-04-21 ENCOUNTER — Other Ambulatory Visit: Payer: Medicare HMO

## 2022-04-23 NOTE — Addendum Note (Signed)
Addended by: Jolinda Croak E on: 04/23/2022 09:15 AM   Modules accepted: Orders

## 2022-05-08 ENCOUNTER — Ambulatory Visit: Payer: Medicare HMO | Admitting: Internal Medicine

## 2022-05-12 ENCOUNTER — Encounter: Payer: Self-pay | Admitting: Family Medicine

## 2022-05-16 ENCOUNTER — Ambulatory Visit: Payer: Medicare HMO | Admitting: Family Medicine

## 2022-05-16 DIAGNOSIS — R030 Elevated blood-pressure reading, without diagnosis of hypertension: Secondary | ICD-10-CM | POA: Diagnosis not present

## 2022-05-16 DIAGNOSIS — Z7901 Long term (current) use of anticoagulants: Secondary | ICD-10-CM | POA: Diagnosis not present

## 2022-05-16 DIAGNOSIS — H04129 Dry eye syndrome of unspecified lacrimal gland: Secondary | ICD-10-CM | POA: Diagnosis not present

## 2022-05-16 DIAGNOSIS — M1990 Unspecified osteoarthritis, unspecified site: Secondary | ICD-10-CM | POA: Diagnosis not present

## 2022-05-16 DIAGNOSIS — Z809 Family history of malignant neoplasm, unspecified: Secondary | ICD-10-CM | POA: Diagnosis not present

## 2022-05-16 DIAGNOSIS — G250 Essential tremor: Secondary | ICD-10-CM | POA: Diagnosis not present

## 2022-05-16 DIAGNOSIS — Z86718 Personal history of other venous thrombosis and embolism: Secondary | ICD-10-CM | POA: Diagnosis not present

## 2022-05-16 DIAGNOSIS — R69 Illness, unspecified: Secondary | ICD-10-CM | POA: Diagnosis not present

## 2022-05-23 ENCOUNTER — Ambulatory Visit: Payer: Medicare HMO | Attending: Family Medicine | Admitting: Physical Therapy

## 2022-05-23 DIAGNOSIS — M48061 Spinal stenosis, lumbar region without neurogenic claudication: Secondary | ICD-10-CM | POA: Insufficient documentation

## 2022-05-23 DIAGNOSIS — R2681 Unsteadiness on feet: Secondary | ICD-10-CM | POA: Diagnosis not present

## 2022-05-23 DIAGNOSIS — M6281 Muscle weakness (generalized): Secondary | ICD-10-CM | POA: Diagnosis not present

## 2022-05-23 DIAGNOSIS — M545 Low back pain, unspecified: Secondary | ICD-10-CM | POA: Insufficient documentation

## 2022-05-23 NOTE — Therapy (Unsigned)
OUTPATIENT PHYSICAL THERAPY THORACOLUMBAR EVALUATION   Patient Name: [name] MRN: [id number] DOB:July 06, 1951, 71 y.o., male Today's Date: 05/24/2022   PT End of Session - 05/24/22 1325     Visit Number 1    Number of Visits --   1-2x/week   Date for PT Re-Evaluation 07/19/22    Authorization Type [employer] MCR    PT Start Time 1130    PT Stop Time 1214    PT Time Calculation (min) 44 min             Past Medical History:  Diagnosis Date   Arthritis    Cervical disc herniation    C 8   Depression    MILD, NO CURRENT MEDS FOR   DJD (degenerative joint disease)    Eczema    Gait abnormality    Headache    History of kidney stones 08/13/2011   Hypercholesteremia    Hypoglycemia    Ligament tear    knee   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   Past Surgical History:  Procedure Laterality Date   CATARACT EXTRACTION Bilateral    x2   CERVICAL DISC FUSED  15 YRS AGO   C 6 TO C 8   EXCISION NEUROMA     scalp   LASIX EYE SURGERY Bilateral YRS AGO   NEPHROLITHOTOMY Left 07/22/2013   Procedure: NEPHROLITHOTOMY PERCUTANEOUS;  Surgeon: Molli Hazard, MD;  Location: [facility];  Service: Urology;  Laterality: Left;   WRIST GANGLION CYST REMOVED Right 11/13/1967   Patient Active Problem List   Diagnosis Date Noted   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   Deep vein thrombosis (DVT) of brachial vein of left upper extremity ([place]) 01/04/2022   Lateral epicondylitis of left elbow 11/24/2013   Left anterior knee pain 10/21/2013   Quadriceps weakness 10/21/2013    PCP: [facility]  REFERRING PROVIDER: Dene Gentry, MD  THERAPY DIAG:  Low back pain, unspecified back pain laterality, unspecified chronicity, unspecified whether sciatica present - Plan: PT plan of care cert/re-cert  Muscle weakness - Plan: PT plan of care cert/re-cert  Unsteadiness on feet - Plan: PT plan of care cert/re-cert  REFERRING DIAG: Spinal stenosis of lumbar  region, unspecified whether neurogenic claudication present [M48.061]  Rationale for Evaluation and Treatment Rehabilitation  SUBJECTIVE:  PERTINENT PAST HISTORY:  DVT brachial vein recently (on anti-coagulation therapy), history of cervical fusion, history of L foot fracture, bil shoulder pain.      PRECAUTIONS: Significant PMH: cervical fusion C6 - C7, DVT brachial artery recently, on anti-coagulation therapy.  WEIGHT BEARING RESTRICTIONS No  FALLS:  Has patient fallen in last 6 months? No, Number of falls: 0  MOI/History of condition:  Onset date: Chronic  [name] is a 71 y.o. male who presents to clinic with chief complaint of bil leg pain L>R.  Pt with chronic low back pathology starting >20 years ago.  He had an incident where he was unable to move his L leg for a period of time and reports a lasting strength deficit on that side.  Pt is known to this clinic and PT.  Subjective from last visit included below for reference.  Since last visit pt has had history significant for increasing leg pain noted above, severe bilateral shoulder pain (improving), and DVT of L brachial vein.  Lumbar Spinal Stenosis Cluster Bilateral Symptoms   positive Leg pain > back pain   positive Pain during walking/standing  positive Pain relief w/ sitting   positive >48 y/o     positive  Subjective from PT eval 07/22/21  "[name] is a 71 y.o. male who presents to clinic complaint of R shoulder pain, pronation/pes planus of L foot, and bilateral radicular LE pain R>L .  MOI/History of condition:  Pt was performing a kayaking maneuver involving end range R lumbar ext and rotation combined with anterior movement of the R shoulder from a position of ext and partial abduction.  This caused an acute exacerbation of his chronic low back pain/radicular pain as well as R shoulder pain.  Imaging 5 years ago revealed multiple disc bulges with some neural impingement.  Also having some lateral L  thigh pain which may have extended below the knee and started roughly 6 months (before kayaking injury) but has since resolved.  LE thigh pain R > L.  Feels L LE is weaker than R.   Pain location: thighs (radicular pain)."   From referring provider:   "Bilateral leg weakness - gross strength ok on exam but noting fatigue, decrease in endurance.  Gets discomfort in low back that is better with flexion, worse with prolonged sitting.  Concern for spinal stenosis of lumbar spine.  Will go ahead with MRI to assess.   Of note at end of visit he reported having pain left foot around the midfoot that has been worse over past year.  Has history of fracture with possible incomplete healing.  Brief MSK u/s noted significant spurring TMT joint about 3rd TMT.  WIll obtain x-rays."    Red flags:  Denies BB changes, saddle anesthesia, and ataxia  Pain:  Are you having pain? Yes Pain location: bil leg pain, general and difficult to describe sensation  NPRS scale:  current 3/10  average 6/10  Aggravating factors: standing, walking, prolonged positions  NPRS, highest: 7-8/10 Relieving factors: possibly sitting, but not completely clear  NPRS: best: 0/10 Pain description: intermittent Stage: Chronic Stability: staying the same or getting worse   Occupation: retired  Administrator, sports: NA  Hand Dominance: NA  Patient Goals/Specific Activities:   Significant PMH: cervical fusion C6 - C7    OBJECTIVE:   DIAGNOSTIC FINDINGS:  MRI ordered, not completed  SENSATION:  Light touch: Appears intact  MUSCLE LENGTH: Hamstrings: Right no restriction; Left no restriction ASLR: Right ASLR = PSLR; Left ASLR = PSLR Thomas test: Right no restriction; Left no restriction Ely's test: Right no restriction; Left no restriction    LUMBAR AROM  AROM AROM  05/24/2022  Flexion WNL  Extension limited by 50%  Right lateral flexion limited by 25%  Left lateral flexion limited by 25%  Right rotation  limited by 25%  Left rotation limited by 25%    (Blank rows = not tested)  DIRECTIONAL PREFERENCE:  flexion  LE MMT:  MMT Right 05/24/2022 Left 05/24/2022  Hip flexion (L2, L3) 4+ 4  Knee extension (L3) 4+ 4  Knee flexion 4+ 4  Hip abduction 4+ 3+  Hip extension 4 4  Hip external rotation    Hip internal rotation    Hip adduction    Ankle dorsiflexion (L4) C C  Ankle plantarflexion (S1) C C  Ankle inversion    Ankle eversion    Great Toe ext (L5) C C  Grossly     (Blank rows = not tested, score listed is out of 5 possible points.  N = WNL, D = diminished, C = clear for gross weakness with  myotome testing, * = concordant pain with testing)   Functional Tests  Eval (05/24/2022)    Supine single leg bridge: L 28'' (more difficult), R 28''    Standard plank from feet: 57'' (norm 70'' from feet)    SLS deficit L >R                                                   LUMBAR SPECIAL TESTS:  Slump: L (+), R (+)  PATIENT SURVEYS:  Take FOTO next visit  PATIENT EDUCATION:  POC, diagnosis, prognosis, HEP, and outcome measures.  Pt educated via explanation, demonstration, and handout (HEP).  Pt confirms understanding verbally.   HOME EXERCISE PROGRAM: None given  ASTERISK SIGNS   Asterisk Signs Eval (05/24/2022)       Standard plank 35''       Pain As high as 8/10       L hip abd 3+/5                         ASSESSMENT:  CLINICAL IMPRESSION: [name] is a 71 y.o. male who presents to clinic with signs and sxs consistent with bil leg pain secondary to spinal stenosis.  Central stenosis test cluster is (+).  Pt is able to tolerate relatively long bouts of exercise (lower extremities) in lumbar flexed position, increasing likelihood of lumbar stenosis and decreasing likelihood of vascular claudication.  L LE is significantly weaker than R, but pt reports chronic history of weakness d/t past low back injury so relation to current pathology is unclear.   OBJECTIVE  IMPAIRMENTS: Pain, LE and core weakness  ACTIVITY LIMITATIONS: lifting, standing for long periods  PERSONAL FACTORS: See medical history and pertinent history   REHAB POTENTIAL:  Poor, chronic with likely cental stenosis  CLINICAL DECISION MAKING: Stable/uncomplicated  EVALUATION COMPLEXITY: Low   GOALS:   SHORT TERM GOALS: Target date: 06/14/2022  [name] will be >75% HEP compliant to improve carryover between sessions and facilitate independent management of condition  Evaluation (05/24/2022): ongoing Goal status: INITIAL   LONG TERM GOALS: Target date: 07/19/2022  [name] will improve FOTO score to projected improvement score as a proxy for functional improvement  Evaluation/Baseline (05/24/2022): Will give next visit Goal status: INITIAL   2.  [name] will self report >/= 50% decrease in pain from evaluation   Evaluation/Baseline (05/24/2022): 8/10 max pain Goal status: INITIAL   3.  [name] will be able to maintain plank for 47'' from feet (norm for healthy adult is ~70'' from feet)   Evaluation/Baseline (05/24/2022): 35''  from feet Goal status: INITIAL   4. [name] will be able to maintain supine single leg bridge for 16'' as evidence of improved hip extension and core strength (norm for healthy adult 30''- 60'')   Evaluation/Baseline (05/24/2022): L 28'' R 28'' Goal status: INITIAL    PLAN: PT FREQUENCY: 1-2x/week  PT DURATION: 8 weeks (Ending 07/19/2022)  PLANNED INTERVENTIONS: Therapeutic exercises, Aquatic therapy, Therapeutic activity, Neuro Muscular re-education, Gait training, Patient/Family education, Joint mobilization, Dry Needling, Electrical stimulation, Spinal mobilization and/or manipulation, Moist heat, Taping, Vasopneumatic device, Ionotophoresis '4mg'$ /ml Dexamethasone, and Manual therapy  PLAN FOR NEXT SESSION: Progressive core strengthening as tolerated   Shearon Balo PT, DPT 05/24/2022, 3:14 PM

## 2022-05-24 ENCOUNTER — Encounter: Payer: Self-pay | Admitting: Physical Therapy

## 2022-05-24 ENCOUNTER — Other Ambulatory Visit: Payer: Self-pay

## 2022-05-24 ENCOUNTER — Encounter: Payer: Self-pay | Admitting: Family Medicine

## 2022-05-25 ENCOUNTER — Telehealth: Payer: Self-pay

## 2022-05-25 NOTE — Telephone Encounter (Signed)
Per pt he is experiencing the following new symptoms since his last office visit:  -The physical therapist is concerned about permanent damage if we don't know if disc herniation is occurring and continue present activity. His opinion is that surgery is in the patient's future and the physical therapy can help put off the inevitable surgery. Surgery as a young and healthy 71 year old is safer than getting it later when age might prevent surgery. *Bladder changes: Patient has an urgent need to pee and then nothing happens. Other times he has seconds to get to the bathroom before peeing in his pants. *History: Left leg was completely paralyzed for 7 days along with intense pain. Herniation of L-3-4. Dr. Deri Fuelling stated at that time that if we fuse L-3-4 he would also do L-4-5 because of changes in that disc. Patient's present pain ranks about 8/10 and the pain is happening more frequently.   We will go ahead and file an expedited appeal to try and get MRI lumbar spine authorized for pre-surgical planning as patient's symptoms are getting worse and he is starting to have bladder incontinence.  Appeal faxed to [facility] at [phone]

## 2022-06-01 ENCOUNTER — Encounter: Payer: Self-pay | Admitting: Physical Therapy

## 2022-06-01 ENCOUNTER — Ambulatory Visit: Payer: Medicare HMO | Admitting: Physical Therapy

## 2022-06-01 DIAGNOSIS — R2681 Unsteadiness on feet: Secondary | ICD-10-CM | POA: Diagnosis not present

## 2022-06-01 DIAGNOSIS — M545 Low back pain, unspecified: Secondary | ICD-10-CM

## 2022-06-01 DIAGNOSIS — M6281 Muscle weakness (generalized): Secondary | ICD-10-CM | POA: Diagnosis not present

## 2022-06-01 DIAGNOSIS — M48061 Spinal stenosis, lumbar region without neurogenic claudication: Secondary | ICD-10-CM | POA: Diagnosis not present

## 2022-06-01 NOTE — Therapy (Signed)
OUTPATIENT PHYSICAL THERAPY TREATMENT NOTE   Patient Name: [name] MRN: [id number] DOB:05/18/1951, 71 y.o., male Today's Date: 06/01/2022  PCP: [facility] REFERRING PROVIDER: Dene Gentry, MD   PT End of Session - 06/01/22 [id number]     Visit Number 2    Number of Visits --   1-2x/week   Date for PT Re-Evaluation 07/19/22    Authorization Type [employer] MCR    PT Start Time 0915    PT Stop Time 0958    PT Time Calculation (min) 43 min             Past Medical History:  Diagnosis Date   Arthritis    Cervical disc herniation    C 8   Depression    MILD, NO CURRENT MEDS FOR   DJD (degenerative joint disease)    Eczema    Gait abnormality    Headache    History of kidney stones 08/13/2011   Hypercholesteremia    Hypoglycemia    Ligament tear    knee   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   Past Surgical History:  Procedure Laterality Date   CATARACT EXTRACTION Bilateral    x2   CERVICAL DISC FUSED  15 YRS AGO   C 6 TO C 8   EXCISION NEUROMA     scalp   LASIX EYE SURGERY Bilateral YRS AGO   NEPHROLITHOTOMY Left 07/22/2013   Procedure: NEPHROLITHOTOMY PERCUTANEOUS;  Surgeon: Molli Hazard, MD;  Location: [facility];  Service: Urology;  Laterality: Left;   WRIST GANGLION CYST REMOVED Right 11/13/1967   Patient Active Problem List   Diagnosis Date Noted   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   Deep vein thrombosis (DVT) of brachial vein of left upper extremity ([place]) 01/04/2022   Lateral epicondylitis of left elbow 11/24/2013   Left anterior knee pain 10/21/2013   Quadriceps weakness 10/21/2013    THERAPY DIAG:  Low back pain, unspecified back pain laterality, unspecified chronicity, unspecified whether sciatica present  Muscle weakness  Unsteadiness on feet  REFERRING DIAG: Spinal stenosis of lumbar region, unspecified whether neurogenic claudication present [M48.061]  PERTINENT HISTORY: DVT brachial vein recently (on  anti-coagulation therapy), history of cervical fusion, history of L foot fracture, bil shoulder pain.  PRECAUTIONS/RESTRICTIONS:   Significant PMH: cervical fusion C6 - C7, DVT brachial artery recently, on anti-coagulation therapy.  SUBJECTIVE:  Pt reports that the plank has caused some discomfort for >24 hours following (I recommended he hold off on these).  Pain:  Are you having pain? Yes Pain location: bil leg pain, general and difficult to describe sensation  NPRS scale:  current 3/10  Aggravating factors: standing, walking, prolonged positions Relieving factors: possibly sitting, but not completely clear Pain description: intermittent Stage: Chronic  OBJECTIVE:  DIAGNOSTIC FINDINGS:  MRI ordered, not completed   SENSATION:          Light touch: Appears intact   MUSCLE LENGTH: Hamstrings: Right no restriction; Left no restriction ASLR: Right ASLR = PSLR; Left ASLR = PSLR Thomas test: Right no restriction; Left no restriction Ely's test: Right no restriction; Left no restriction       LUMBAR AROM   AROM AROM  05/24/2022  Flexion WNL  Extension limited by 50%  Right lateral flexion limited by 25%  Left lateral flexion limited by 25%  Right rotation limited by 25%  Left rotation limited by 25%    (Blank rows = not tested)  DIRECTIONAL PREFERENCE:           flexion   LE MMT:   MMT Right 05/24/2022 Left 05/24/2022  Hip flexion (L2, L3) 4+ 4  Knee extension (L3) 4+ 4  Knee flexion 4+ 4  Hip abduction 4+ 3+  Hip extension 4 4  Hip external rotation      Hip internal rotation      Hip adduction      Ankle dorsiflexion (L4) C C  Ankle plantarflexion (S1) C C  Ankle inversion      Ankle eversion      Great Toe ext (L5) C C  Grossly        (Blank rows = not tested, score listed is out of 5 possible points.  N = WNL, D = diminished, C = clear for gross weakness with myotome testing, * = concordant pain with testing)     Functional Tests   Eval  (05/24/2022)      Supine single leg bridge: L 28'' (more difficult), R 28''      Standard plank from feet: 28'' (norm 70'' from feet)      SLS deficit L >R                                                                                        LUMBAR SPECIAL TESTS:  Slump: L (+), R (+)   PATIENT SURVEYS:  Take FOTO next visit   PATIENT EDUCATION:  POC, diagnosis, prognosis, HEP, and outcome measures.  Pt educated via explanation, demonstration, and handout (HEP).  Pt confirms understanding verbally.    HOME EXERCISE PROGRAM: None given   ASTERISK SIGNS     Asterisk Signs Eval (05/24/2022)            Standard plank 35''            Pain As high as 8/10            L hip abd 3+/5                                              TREATMENT 7/21:  Therapeutic Exercise: - Bike 35mwhile taking subjective and planning session with patient - LTR 20x - LE X over - Bridge 10x  - 2x10 lumbar locked bridge - Side plank with clam - GTB - 3x10 ea - dead bug with ball - 3x10 - bird dog - 10'' holds x10 -Ardine Engpose - 321' - RDL - 10x ea  ASSESSMENT:   CLINICAL IMPRESSION: SJayvandid well with therapy today with no adverse reaction.  We discussed activity modification to avoid lumbar ext.  He will have MRI tomorrow with follow up with MD on Wednesday.  No increase in sxs with therex today.   OBJECTIVE IMPAIRMENTS: Pain, LE and core weakness   ACTIVITY LIMITATIONS: lifting, standing for long periods   PERSONAL FACTORS: See medical history and pertinent history     REHAB POTENTIAL:  Poor, chronic with likely cental stenosis   CLINICAL DECISION MAKING:  Stable/uncomplicated   EVALUATION COMPLEXITY: Low     GOALS:     SHORT TERM GOALS: Target date: 06/14/2022   Zymarion will be >75% HEP compliant to improve carryover between sessions and facilitate independent management of condition   Evaluation (05/24/2022): ongoing Goal status: INITIAL     LONG TERM GOALS: Target  date: 07/19/2022   Auther will improve FOTO score to projected improvement score as a proxy for functional improvement   Evaluation/Baseline (05/24/2022): Will give next visit Goal status: INITIAL     2.  Primitivo will self report >/= 50% decrease in pain from evaluation    Evaluation/Baseline (05/24/2022): 8/10 max pain Goal status: INITIAL     3.  El will be able to maintain plank for 50'' from feet (norm for healthy adult is ~70'' from feet)    Evaluation/Baseline (05/24/2022): 35''  from feet Goal status: INITIAL               4. [place] will be able to maintain supine single leg bridge for 45'' as evidence of improved hip extension and core strength (norm for healthy adult 30''- 60'')    Evaluation/Baseline (05/24/2022): L 28'' R 28'' Goal status: INITIAL       PLAN: PT FREQUENCY: 1-2x/week   PT DURATION: 8 weeks (Ending 07/19/2022)   PLANNED INTERVENTIONS: Therapeutic exercises, Aquatic therapy, Therapeutic activity, Neuro Muscular re-education, Gait training, Patient/Family education, Joint mobilization, Dry Needling, Electrical stimulation, Spinal mobilization and/or manipulation, Moist heat, Taping, Vasopneumatic device, Ionotophoresis '4mg'$ /ml Dexamethasone, and Manual therapy   PLAN FOR NEXT SESSION: Progressive core strengthening as tolerated   Kevan Ny Ha Placeres PT 06/01/2022, 9:59 AM

## 2022-06-02 ENCOUNTER — Ambulatory Visit
Admission: RE | Admit: 2022-06-02 | Discharge: 2022-06-02 | Disposition: A | Discharge status: Home / Self Care | Payer: Medicare HMO | Source: Ambulatory Visit | Attending: Family Medicine | Admitting: Family Medicine

## 2022-06-02 DIAGNOSIS — R29898 Other symptoms and signs involving the musculoskeletal system: Secondary | ICD-10-CM | POA: Diagnosis not present

## 2022-06-02 DIAGNOSIS — M48061 Spinal stenosis, lumbar region without neurogenic claudication: Secondary | ICD-10-CM

## 2022-06-02 DIAGNOSIS — M545 Low back pain, unspecified: Secondary | ICD-10-CM | POA: Diagnosis not present

## 2022-06-03 ENCOUNTER — Encounter: Payer: Self-pay | Admitting: Family Medicine

## 2022-06-03 ENCOUNTER — Encounter: Payer: Self-pay | Admitting: Physical Therapy

## 2022-06-05 NOTE — Telephone Encounter (Signed)
Called patient and talked to him yesterday - see addendum to his MRI report.

## 2022-06-06 ENCOUNTER — Ambulatory Visit: Payer: Medicare HMO | Admitting: Family Medicine

## 2022-06-13 ENCOUNTER — Ambulatory Visit: Payer: Medicare HMO | Attending: Family Medicine | Admitting: Physical Therapy

## 2022-06-13 ENCOUNTER — Encounter: Payer: Self-pay | Admitting: Physical Therapy

## 2022-06-13 DIAGNOSIS — R2681 Unsteadiness on feet: Secondary | ICD-10-CM | POA: Diagnosis not present

## 2022-06-13 DIAGNOSIS — M545 Low back pain, unspecified: Secondary | ICD-10-CM | POA: Diagnosis not present

## 2022-06-13 DIAGNOSIS — M6281 Muscle weakness (generalized): Secondary | ICD-10-CM | POA: Diagnosis not present

## 2022-06-13 NOTE — Therapy (Signed)
OUTPATIENT PHYSICAL THERAPY TREATMENT NOTE   Patient Name: [name] MRN: [id number] DOB:1951/01/08, 71 y.o., male Today's Date: 06/13/2022  PCP: [facility] REFERRING PROVIDER: Dene Gentry, MD   PT End of Session - 06/13/22 0912     Visit Number 3    Number of Visits --   1-2x/week   Date for PT Re-Evaluation 07/19/22    Authorization Type [employer] MCR    PT Start Time 0915    PT Stop Time 0956    PT Time Calculation (min) 41 min             Past Medical History:  Diagnosis Date   Arthritis    Cervical disc herniation    C 8   Depression    MILD, NO CURRENT MEDS FOR   DJD (degenerative joint disease)    Eczema    Gait abnormality    Headache    History of kidney stones 08/13/2011   Hypercholesteremia    Hypoglycemia    Ligament tear    knee   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   Past Surgical History:  Procedure Laterality Date   CATARACT EXTRACTION Bilateral    x2   CERVICAL DISC FUSED  15 YRS AGO   C 6 TO C 8   EXCISION NEUROMA     scalp   LASIX EYE SURGERY Bilateral YRS AGO   NEPHROLITHOTOMY Left 07/22/2013   Procedure: NEPHROLITHOTOMY PERCUTANEOUS;  Surgeon: Molli Hazard, MD;  Location: [facility];  Service: Urology;  Laterality: Left;   WRIST GANGLION CYST REMOVED Right 11/13/1967   Patient Active Problem List   Diagnosis Date Noted   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   Deep vein thrombosis (DVT) of brachial vein of left upper extremity ([place]) 01/04/2022   Lateral epicondylitis of left elbow 11/24/2013   Left anterior knee pain 10/21/2013   Quadriceps weakness 10/21/2013    THERAPY DIAG:  Low back pain, unspecified back pain laterality, unspecified chronicity, unspecified whether sciatica present  Muscle weakness  Unsteadiness on feet  REFERRING DIAG: Spinal stenosis of lumbar region, unspecified whether neurogenic claudication present [M48.061]  PERTINENT HISTORY: DVT brachial vein recently (on  anti-coagulation therapy), history of cervical fusion, history of L foot fracture, bil shoulder pain.  PRECAUTIONS/RESTRICTIONS:   Significant PMH: cervical fusion C6 - C7, DVT brachial artery recently, on anti-coagulation therapy.  SUBJECTIVE:  Pt reports reports that he feels some improvement in his leg pain.  Pain:  Are you having pain? Yes Pain location: bil leg pain, general and difficult to describe sensation  NPRS scale:  current 3/10  Aggravating factors: standing, walking, prolonged positions Relieving factors: possibly sitting, but not completely clear Pain description: intermittent Stage: Chronic  OBJECTIVE:  DIAGNOSTIC FINDINGS:  MRI ordered, not completed   SENSATION:          Light touch: Appears intact   MUSCLE LENGTH: Hamstrings: Right no restriction; Left no restriction ASLR: Right ASLR = PSLR; Left ASLR = PSLR Thomas test: Right no restriction; Left no restriction Ely's test: Right no restriction; Left no restriction       LUMBAR AROM   AROM AROM  05/24/2022  Flexion WNL  Extension limited by 50%  Right lateral flexion limited by 25%  Left lateral flexion limited by 25%  Right rotation limited by 25%  Left rotation limited by 25%    (Blank rows = not tested)   DIRECTIONAL PREFERENCE:  flexion   LE MMT:   MMT Right 05/24/2022 Left 05/24/2022  Hip flexion (L2, L3) 4+ 4  Knee extension (L3) 4+ 4  Knee flexion 4+ 4  Hip abduction 4+ 3+  Hip extension 4 4  Hip external rotation      Hip internal rotation      Hip adduction      Ankle dorsiflexion (L4) C C  Ankle plantarflexion (S1) C C  Ankle inversion      Ankle eversion      Great Toe ext (L5) C C  Grossly        (Blank rows = not tested, score listed is out of 5 possible points.  N = WNL, D = diminished, C = clear for gross weakness with myotome testing, * = concordant pain with testing)     Functional Tests   Eval (05/24/2022)      Supine single leg bridge: L 28''  (more difficult), R 28''      Standard plank from feet: 35'' (norm 70'' from feet)      SLS deficit L >R                                                                                        LUMBAR SPECIAL TESTS:  Slump: L (+), R (+)   PATIENT SURVEYS:  Take FOTO next visit   PATIENT EDUCATION:  POC, diagnosis, prognosis, HEP, and outcome measures.  Pt educated via explanation, demonstration, and handout (HEP).  Pt confirms understanding verbally.    HOME EXERCISE PROGRAM: Access Code: [email or web address] URL: [email or web address] Date: 06/13/2022 Prepared by: Shearon Balo  Exercises - Supine Lower Trunk Rotation  - 1 x daily - 7 x weekly - 1 sets - 20 reps - 3 hold - Supine Bridge with Knee to Chest  - 1 x daily - 7 x weekly - 3 sets - 10 reps - Supine Dead Bug with Leg Extension  - 1 x daily - 7 x weekly - 3 sets - 12 reps - Side Plank with Clam and Resistance  - 1 x daily - 7 x weekly - 3 sets - 10 reps - Bird Dog  - 1 x daily - 7 x weekly - 1 sets - 10 reps - 10 hold - Standing Single Arm Shoulder Internal Rotation in Abduction with Anchored Resistance  - 1 x daily - 7 x weekly - 3 sets - 10 reps - Standing Single Arm Shoulder External Rotation in Abduction with Anchored Resistance  - 1 x daily - 7 x weekly - 3 sets - 10 reps   ASTERISK SIGNS     Asterisk Signs Eval (05/24/2022) 8/2           Standard plank 35''            Pain As high as 8/10 5-6/10           L hip abd 3+/5  4-  TREATMENT 8/2:  Therapeutic Exercise: - Bike 30mwhile taking subjective and planning session with patient - LE X over - Bridge  - 2x10 lumbar locked bridge - Side plank with clam - Blue TB - 2x10 ea - S/L hip abd - dead bug with ball - 3x10 - bird dog - 10'' holds x10 - IR/ER at 45 degrees  TREATMENT 7/21:  Therapeutic Exercise: - Bike 519mhile taking subjective and planning session with patient - LTR 20x - LE X over -  Bridge 10x  - 2x10 lumbar locked bridge - Side plank with clam - GTB - 3x10 ea - dead bug with ball - 3x10 - bird dog - 10'' holds x10 - Ardine Engose - 3074 - RDL - 10x ea  ASSESSMENT:   CLINICAL IMPRESSION: StKirstenid well with therapy with no adverse reaction.  His baseline pain is lower with less frequent episodes of leg pain which is encouraging.  I updated his HEP.  He will follow up with neuro surgery in the near future.   OBJECTIVE IMPAIRMENTS: Pain, LE and core weakness   ACTIVITY LIMITATIONS: lifting, standing for long periods   PERSONAL FACTORS: See medical history and pertinent history     REHAB POTENTIAL:  Poor, chronic with likely cental stenosis   CLINICAL DECISION MAKING: Stable/uncomplicated   EVALUATION COMPLEXITY: Low     GOALS:     SHORT TERM GOALS: Target date: 06/14/2022   StDevontaill be >75% HEP compliant to improve carryover between sessions and facilitate independent management of condition   Evaluation (05/24/2022): ongoing Goal status: Met 8/2     LONG TERM GOALS: Target date: 07/19/2022   StNakhiill improve FOTO score to projected improvement score as a proxy for functional improvement   Evaluation/Baseline (05/24/2022): Will give next visit Goal status: INITIAL     2.  StTellisill self report >/= 50% decrease in pain from evaluation    Evaluation/Baseline (05/24/2022): 8/10 max pain Goal status: INITIAL     3.  StAtwellill be able to maintain plank for 50'' from feet (norm for healthy adult is ~70'' from feet)    Evaluation/Baseline (05/24/2022): 35''  from feet Goal status: INITIAL               4. StEaronill be able to maintain supine single leg bridge for 4576 as evidence of improved hip extension and core strength (norm for healthy adult 30''- 60'')    Evaluation/Baseline (05/24/2022): L 28'' R 28'' Goal status: INITIAL       PLAN: PT FREQUENCY: 1-2x/week   PT DURATION: 8 weeks (Ending 07/19/2022)   PLANNED INTERVENTIONS:  Therapeutic exercises, Aquatic therapy, Therapeutic activity, Neuro Muscular re-education, Gait training, Patient/Family education, Joint mobilization, Dry Needling, Electrical stimulation, Spinal mobilization and/or manipulation, Moist heat, Taping, Vasopneumatic device, Ionotophoresis 9m58ml Dexamethasone, and Manual therapy   PLAN FOR NEXT SESSION: Progressive core strengthening as tolerated   KarKevan Nyinhartsen PT 06/13/2022, 9:59 AM

## 2022-06-20 ENCOUNTER — Ambulatory Visit: Payer: Medicare HMO | Admitting: Physical Therapy

## 2022-06-21 DIAGNOSIS — M48062 Spinal stenosis, lumbar region with neurogenic claudication: Secondary | ICD-10-CM | POA: Diagnosis not present

## 2022-06-21 DIAGNOSIS — M4310 Spondylolisthesis, site unspecified: Secondary | ICD-10-CM | POA: Diagnosis not present

## 2022-06-25 NOTE — Telephone Encounter (Signed)
[facility] Dr Trenton Gammon Aug 10th at [address], [place], [place] [address] Phone: [phone]

## 2022-06-28 ENCOUNTER — Encounter: Payer: Self-pay | Admitting: Physical Therapy

## 2022-06-28 ENCOUNTER — Ambulatory Visit: Payer: Medicare HMO | Admitting: Physical Therapy

## 2022-06-28 DIAGNOSIS — M6281 Muscle weakness (generalized): Secondary | ICD-10-CM | POA: Diagnosis not present

## 2022-06-28 DIAGNOSIS — R2681 Unsteadiness on feet: Secondary | ICD-10-CM

## 2022-06-28 DIAGNOSIS — M545 Low back pain, unspecified: Secondary | ICD-10-CM | POA: Diagnosis not present

## 2022-06-28 NOTE — Therapy (Signed)
PHYSICAL THERAPY DISCHARGE SUMMARY  Visits from Start of Care: 4  Current functional level related to goals / functional outcomes: See assessment/goals   Remaining deficits: See assessment/goals   Education / Equipment: HEP and D/C plans  Patient agrees to discharge. Patient goals were met. Patient is being discharged due to meeting the stated rehab goals.   Patient Name: [name] MRN: [id number] DOB:1951/03/24, 71 y.o., male Today's Date: 06/28/2022  PCP: [facility] REFERRING PROVIDER: Dene Gentry, MD   PT End of Session - 06/28/22 [id number]     Visit Number 4    Number of Visits --   1-2x/week   Date for PT Re-Evaluation 07/19/22    Authorization Type Aet[employer] MCR    PT Start Time 0915    PT Stop Time 0956    PT Time Calculation (min) 41 min             Past Medical History:  Diagnosis Date   Arthritis    Cervical disc herniation    C 8   Depression    MILD, NO CURRENT MEDS FOR   DJD (degenerative joint disease)    Eczema    Gait abnormality    Headache    History of kidney stones 08/13/2011   Hypercholesteremia    Hypoglycemia    Ligament tear    knee   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   Past Surgical History:  Procedure Laterality Date   CATARACT EXTRACTION Bilateral    x2   CERVICAL DISC FUSED  15 YRS AGO   C 6 TO C 8   EXCISION NEUROMA     scalp   LASIX EYE SURGERY Bilateral YRS AGO   NEPHROLITHOTOMY Left 07/22/2013   Procedure: NEPHROLITHOTOMY PERCUTANEOUS;  Surgeon: Molli Hazard, MD;  Location: [facility];  Service: Urology;  Laterality: Left;   WRIST GANGLION CYST REMOVED Right 11/13/1967   Patient Active Problem List   Diagnosis Date Noted   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   Deep vein thrombosis (DVT) of brachial vein of left upper extremity ([place]) 01/04/2022   Lateral epicondylitis of left elbow 11/24/2013   Left anterior knee pain 10/21/2013   Quadriceps weakness 10/21/2013    THERAPY  DIAG:  Low back pain, unspecified back pain laterality, unspecified chronicity, unspecified whether sciatica present  Muscle weakness  Unsteadiness on feet  REFERRING DIAG: Spinal stenosis of lumbar region, unspecified whether neurogenic claudication present [M48.061]  PERTINENT HISTORY: DVT brachial vein recently (on anti-coagulation therapy), history of cervical fusion, history of L foot fracture, bil shoulder pain.  PRECAUTIONS/RESTRICTIONS:   Significant PMH: cervical fusion C6 - C7, DVT brachial artery recently, on anti-coagulation therapy.  SUBJECTIVE:  Pt reports that his leg pain is more mild, occurs less often, and pain duration is reduced.  Pain:  Are you having pain? Yes Pain location: bil leg pain, general and difficult to describe sensation  NPRS scale:  current 3/10  Aggravating factors: standing, walking, prolonged positions Relieving factors: possibly sitting, but not completely clear Pain description: intermittent Stage: Chronic  OBJECTIVE:  DIAGNOSTIC FINDINGS:  MRI ordered, not completed   SENSATION:          Light touch: Appears intact   MUSCLE LENGTH: Hamstrings: Right no restriction; Left no restriction ASLR: Right ASLR = PSLR; Left ASLR = PSLR Thomas test: Right no restriction; Left no restriction Ely's test: Right no restriction; Left no restriction       LUMBAR AROM  AROM AROM  05/24/2022  Flexion WNL  Extension limited by 50%  Right lateral flexion limited by 25%  Left lateral flexion limited by 25%  Right rotation limited by 25%  Left rotation limited by 25%    (Blank rows = not tested)   DIRECTIONAL PREFERENCE:           flexion   LE MMT:   MMT Right 05/24/2022 Left 05/24/2022  Hip flexion (L2, L3) 4+ 4  Knee extension (L3) 4+ 4  Knee flexion 4+ 4  Hip abduction 4+ 3+  Hip extension 4 4  Hip external rotation      Hip internal rotation      Hip adduction      Ankle dorsiflexion (L4) C C  Ankle plantarflexion (S1) C C   Ankle inversion      Ankle eversion      Great Toe ext (L5) C C  Grossly        (Blank rows = not tested, score listed is out of 5 possible points.  N = WNL, D = diminished, C = clear for gross weakness with myotome testing, * = concordant pain with testing)     Functional Tests   Eval (05/24/2022)      Supine single leg bridge: L 28'' (more difficult), R 28''      Standard plank from feet: 76'' (norm 70'' from feet)      SLS deficit L >R                                                                                        LUMBAR SPECIAL TESTS:  Slump: L (+), R (+)   PATIENT SURVEYS:  Take FOTO next visit   PATIENT EDUCATION:  POC, diagnosis, prognosis, HEP, and outcome measures.  Pt educated via explanation, demonstration, and handout (HEP).  Pt confirms understanding verbally.    HOME EXERCISE PROGRAM: Access Code: [email or web address] URL: [email or web address] Date: 06/13/2022 Prepared by: Shearon Balo  Exercises - Supine Lower Trunk Rotation  - 1 x daily - 7 x weekly - 1 sets - 20 reps - 3 hold - Supine Bridge with Knee to Chest  - 1 x daily - 7 x weekly - 3 sets - 10 reps - Supine Dead Bug with Leg Extension  - 1 x daily - 7 x weekly - 3 sets - 12 reps - Side Plank with Clam and Resistance  - 1 x daily - 7 x weekly - 3 sets - 10 reps - Bird Dog  - 1 x daily - 7 x weekly - 1 sets - 10 reps - 10 hold - Standing Single Arm Shoulder Internal Rotation in Abduction with Anchored Resistance  - 1 x daily - 7 x weekly - 3 sets - 10 reps - Standing Single Arm Shoulder External Rotation in Abduction with Anchored Resistance  - 1 x daily - 7 x weekly - 3 sets - 10 reps   ASTERISK SIGNS     Asterisk Signs Eval (05/24/2022) 8/2           Standard plank 35''  Pain As high as 8/10 5-6/10           L hip abd 3+/5  4-                                           TREATMENT 8/17:  Therapeutic Exercise: - Bike 61mwhile taking subjective and planning  session with patient - LE X over - Bridge  - 2x10 lumbar locked bridge - Side plank with clam - Blue TB - 3x10 ea - dead bug with ball - 2x16 - bird dog - 10'' holds x10 - IR/ER at 45 degrees - [place] 10x ea  Therapeutic Activity - collecting information for goals, checking progress, and reviewing with patient  TREATMENT 8/2:  Therapeutic Exercise: - Bike 539mhile taking subjective and planning session with patient - LE X over - Bridge  - 2x10 lumbar locked bridge - Side plank with clam - Blue TB - 2x10 ea - S/L hip abd - dead bug with ball - 3x10 - bird dog - 10'' holds x10 - IR/ER at 45 degrees  TREATMENT 7/21:  Therapeutic Exercise: - Bike 57m457mile taking subjective and planning session with patient - LTR 20x - LE X over - Bridge 10x  - 2x10 lumbar locked bridge - Side plank with clam - GTB - 3x10 ea - dead bug with ball - 3x10 - bird dog - 10'' holds x10 - CArdine Engse - 30'1- RDL - 10x ea  ASSESSMENT:   CLINICAL IMPRESSION: [name] progressed well with therapy.  Improved impairments include: bil leg pain, core strength.  Functional improvements include: increased ability to complete ADLs and recreation.  Progressions needed include: continued work at home with HEP.  Barriers to progress include: NA.  Please see GOALS section for progress on short term and long term goals established at evaluation.  I recommend D/C home with HEP; pt agrees with plan.   OBJECTIVE IMPAIRMENTS: Pain, LE and core weakness   ACTIVITY LIMITATIONS: lifting, standing for long periods   PERSONAL FACTORS: See medical history and pertinent history     REHAB POTENTIAL:  Poor, chronic with likely cental stenosis   CLINICAL DECISION MAKING: Stable/uncomplicated   EVALUATION COMPLEXITY: Low     GOALS:     SHORT TERM GOALS: Target date: 06/14/2022   [name] be >75% HEP compliant to improve carryover between sessions and facilitate independent management of  condition   Evaluation (05/24/2022): ongoing Goal status: Met 8/2     LONG TERM GOALS: Target date: 07/19/2022   [name] improve FOTO score to projected improvement score as a proxy for functional improvement   Evaluation/Baseline (05/24/2022): Will give next visit Goal status: NA     2.  [name] self report >/= 50% decrease in pain from evaluation    Evaluation/Baseline (05/24/2022): 8/10 max pain 8/17: 75% Goal status: MET     3.  [name] be able to maintain plank for 50'' from feet (norm for healthy adult is ~70'' from feet)    Evaluation/Baseline (05/24/2022): 35''  from feet 8/17: 50''  Goal status: MET               4. [name] be able to maintain supine single leg bridge for 45'46as evidence of improved hip extension and core strength (norm for healthy adult 30''- 60'')    Evaluation/Baseline (05/24/2022):  L 28'' R 28'' R: 45''+, L 45''+ Goal status: MET       PLAN: PT FREQUENCY: 1-2x/week   PT DURATION: 8 weeks (Ending 07/19/2022)   PLANNED INTERVENTIONS: Therapeutic exercises, Aquatic therapy, Therapeutic activity, Neuro Muscular re-education, Gait training, Patient/Family education, Joint mobilization, Dry Needling, Electrical stimulation, Spinal mobilization and/or manipulation, Moist heat, Taping, Vasopneumatic device, Ionotophoresis 79m/ml Dexamethasone, and Manual therapy   PLAN FOR NEXT SESSION: Progressive core strengthening as tolerated   KKevan NyReinhartsen PT 06/28/2022, 9:57 AM

## 2022-07-17 DIAGNOSIS — I8290 Acute embolism and thrombosis of unspecified vein: Secondary | ICD-10-CM | POA: Diagnosis not present

## 2022-07-18 DIAGNOSIS — M48062 Spinal stenosis, lumbar region with neurogenic claudication: Secondary | ICD-10-CM | POA: Diagnosis not present

## 2022-08-07 DIAGNOSIS — D352 Benign neoplasm of pituitary gland: Secondary | ICD-10-CM | POA: Diagnosis not present

## 2022-08-07 DIAGNOSIS — Z87442 Personal history of urinary calculi: Secondary | ICD-10-CM | POA: Diagnosis not present

## 2022-08-07 DIAGNOSIS — D497 Neoplasm of unspecified behavior of endocrine glands and other parts of nervous system: Secondary | ICD-10-CM | POA: Diagnosis not present

## 2022-08-31 DIAGNOSIS — E755 Other lipid storage disorders: Secondary | ICD-10-CM | POA: Diagnosis not present

## 2022-09-18 DIAGNOSIS — D225 Melanocytic nevi of trunk: Secondary | ICD-10-CM | POA: Diagnosis not present

## 2022-09-18 DIAGNOSIS — D485 Neoplasm of uncertain behavior of skin: Secondary | ICD-10-CM | POA: Diagnosis not present

## 2022-09-18 DIAGNOSIS — L820 Inflamed seborrheic keratosis: Secondary | ICD-10-CM | POA: Diagnosis not present

## 2022-09-18 DIAGNOSIS — L858 Other specified epidermal thickening: Secondary | ICD-10-CM | POA: Diagnosis not present

## 2022-09-18 DIAGNOSIS — Z1283 Encounter for screening for malignant neoplasm of skin: Secondary | ICD-10-CM | POA: Diagnosis not present

## 2022-11-27 ENCOUNTER — Other Ambulatory Visit: Payer: Self-pay | Admitting: Internal Medicine

## 2022-11-27 ENCOUNTER — Encounter: Payer: Self-pay | Admitting: Internal Medicine

## 2022-11-27 DIAGNOSIS — D352 Benign neoplasm of pituitary gland: Secondary | ICD-10-CM

## 2022-12-06 DIAGNOSIS — R69 Illness, unspecified: Secondary | ICD-10-CM | POA: Diagnosis not present

## 2022-12-12 ENCOUNTER — Ambulatory Visit
Admission: RE | Admit: 2022-12-12 | Discharge: 2022-12-12 | Disposition: A | Discharge status: Home / Self Care | Payer: Medicare HMO | Source: Ambulatory Visit | Attending: Internal Medicine | Admitting: Internal Medicine

## 2022-12-12 DIAGNOSIS — D352 Benign neoplasm of pituitary gland: Secondary | ICD-10-CM

## 2022-12-12 MED ORDER — GADOPICLENOL 0.5 MMOL/ML IV SOLN
7.5000 mL | Freq: Once | INTRAVENOUS | Status: AC | PRN
  Administered 2022-12-12: 7.5 mL via INTRAVENOUS

## 2022-12-18 DIAGNOSIS — D352 Benign neoplasm of pituitary gland: Secondary | ICD-10-CM | POA: Diagnosis not present

## 2022-12-18 DIAGNOSIS — D497 Neoplasm of unspecified behavior of endocrine glands and other parts of nervous system: Secondary | ICD-10-CM | POA: Diagnosis not present

## 2022-12-25 DIAGNOSIS — Z9181 History of falling: Secondary | ICD-10-CM | POA: Diagnosis not present

## 2022-12-25 DIAGNOSIS — D497 Neoplasm of unspecified behavior of endocrine glands and other parts of nervous system: Secondary | ICD-10-CM | POA: Diagnosis not present

## 2022-12-25 DIAGNOSIS — D352 Benign neoplasm of pituitary gland: Secondary | ICD-10-CM | POA: Diagnosis not present

## 2022-12-25 DIAGNOSIS — Z87442 Personal history of urinary calculi: Secondary | ICD-10-CM | POA: Diagnosis not present

## 2023-01-02 DIAGNOSIS — H31001 Unspecified chorioretinal scars, right eye: Secondary | ICD-10-CM | POA: Diagnosis not present

## 2023-01-02 DIAGNOSIS — Z961 Presence of intraocular lens: Secondary | ICD-10-CM | POA: Diagnosis not present

## 2023-01-02 DIAGNOSIS — H524 Presbyopia: Secondary | ICD-10-CM | POA: Diagnosis not present

## 2023-01-23 DIAGNOSIS — D352 Benign neoplasm of pituitary gland: Secondary | ICD-10-CM | POA: Diagnosis not present

## 2023-01-23 DIAGNOSIS — D497 Neoplasm of unspecified behavior of endocrine glands and other parts of nervous system: Secondary | ICD-10-CM | POA: Diagnosis not present

## 2023-02-12 ENCOUNTER — Other Ambulatory Visit (HOSPITAL_COMMUNITY): Payer: Self-pay | Admitting: Internal Medicine

## 2023-02-12 DIAGNOSIS — Z86718 Personal history of other venous thrombosis and embolism: Secondary | ICD-10-CM | POA: Diagnosis not present

## 2023-02-12 DIAGNOSIS — M5136 Other intervertebral disc degeneration, lumbar region: Secondary | ICD-10-CM | POA: Diagnosis not present

## 2023-02-12 DIAGNOSIS — Z1389 Encounter for screening for other disorder: Secondary | ICD-10-CM | POA: Diagnosis not present

## 2023-02-12 DIAGNOSIS — Z125 Encounter for screening for malignant neoplasm of prostate: Secondary | ICD-10-CM | POA: Diagnosis not present

## 2023-02-12 DIAGNOSIS — Z0000 Encounter for general adult medical examination without abnormal findings: Secondary | ICD-10-CM | POA: Diagnosis not present

## 2023-02-12 DIAGNOSIS — Z23 Encounter for immunization: Secondary | ICD-10-CM | POA: Diagnosis not present

## 2023-02-12 DIAGNOSIS — M79602 Pain in left arm: Secondary | ICD-10-CM

## 2023-02-12 DIAGNOSIS — R69 Illness, unspecified: Secondary | ICD-10-CM | POA: Diagnosis not present

## 2023-02-12 DIAGNOSIS — E7800 Pure hypercholesterolemia, unspecified: Secondary | ICD-10-CM | POA: Diagnosis not present

## 2023-02-12 DIAGNOSIS — E559 Vitamin D deficiency, unspecified: Secondary | ICD-10-CM | POA: Diagnosis not present

## 2023-02-13 ENCOUNTER — Ambulatory Visit (HOSPITAL_COMMUNITY)
Admission: RE | Admit: 2023-02-13 | Discharge: 2023-02-13 | Disposition: A | Discharge status: Home / Self Care | Payer: Medicare HMO | Source: Ambulatory Visit | Attending: Internal Medicine | Admitting: Internal Medicine

## 2023-02-13 ENCOUNTER — Encounter: Payer: Self-pay | Admitting: Hematology and Oncology

## 2023-02-13 DIAGNOSIS — M79602 Pain in left arm: Secondary | ICD-10-CM | POA: Insufficient documentation

## 2023-02-13 DIAGNOSIS — M7989 Other specified soft tissue disorders: Secondary | ICD-10-CM | POA: Diagnosis not present

## 2023-02-26 ENCOUNTER — Encounter: Payer: Self-pay | Admitting: Hematology and Oncology

## 2023-02-26 ENCOUNTER — Inpatient Hospital Stay: Payer: Medicare HMO | Attending: Hematology and Oncology | Admitting: Hematology and Oncology

## 2023-02-26 ENCOUNTER — Other Ambulatory Visit: Payer: Self-pay

## 2023-02-26 VITALS — BP 122/71 | HR 63 | Temp 97.7°F | Resp 18 | Ht 71.0 in | Wt 163.0 lb

## 2023-02-26 DIAGNOSIS — Z7982 Long term (current) use of aspirin: Secondary | ICD-10-CM | POA: Diagnosis not present

## 2023-02-26 DIAGNOSIS — I82622 Acute embolism and thrombosis of deep veins of left upper extremity: Secondary | ICD-10-CM | POA: Diagnosis not present

## 2023-02-26 DIAGNOSIS — Z86718 Personal history of other venous thrombosis and embolism: Secondary | ICD-10-CM | POA: Insufficient documentation

## 2023-02-26 NOTE — Assessment & Plan Note (Addendum)
We discussed limitation of testing with D-dimer His recent repeat ultrasound venous Doppler was negative We discussed the risk and benefits of ordering hypercoagulable panel to look for thrombophilia disorder We discussed the role of aspirin therapy for secondary prevention  I shared with the patient publication in the NEJM in 2012.  Aspirin for Preventing the Recurrence of Venous Thromboembolism Benay Pike, M.D., Ph.D., Chrisandra Netters, M.D., Lonia Farber, M.D., Vallery Ridge, M.D., Milas Hock, M.D., Yetta Flock, M.D., Veronda Prude, M.D., Roni Bread, M.D., Cardell Peach, M.D., Consuello Bossier, M.D., Charleston Ropes, M.D., and Ileana Roup, M.D., Ph.D., for the [facility] Investigators* Malva Limes Med 2012; 366:1959-1967May 24, 2012DOI: 10.1056/[id number]  The aim of the Aspirin for the Prevention of Recurrent Venous Thromboembolism (the Warfarin and Aspirin [WARFASA]) study was to assess the clinical benefit of aspirin for the prevention of recurrence after a course of treatment with vitamin K antagonists in patients with unprovoked venous thromboembolism.  WARFASA was a multicenter, investigator-initiated, randomized, double-blind clinical trial. Eligible patients were randomly assigned to aspirin, 100 mg once daily, or placebo for 2 years, with the option of extending the study treatment. Randomization occurred within 2 weeks after vitamin K antagonists had been withdrawn.  Venous thromboembolism recurred in 28 of the 205 patients who received aspirin and in 43 of the 197 patients who received placebo (6.6% vs. 11.2% per year; hazard ratio, 0.58; 95% confidence interval [CI], 0.36 to 0.93) (median study period, 24.6 months). During a median treatment period of 23.9 months, 23 patients taking aspirin and 39 taking placebo had a recurrence (5.9% vs. 11.0% per year; hazard ratio, 0.55; 95% CI, 0.33 to 0.92). One patient in each treatment group had a major bleeding  episode. Adverse events were similar in the two groups.  Aspirin reduced the risk of recurrence when given to patients with unprovoked venous thromboembolism who had discontinued anticoagulant treatment, with no apparent increase in the risk of major bleeding  As aspirin is not available in 100 mg dose I would recommend him to take 81 mg aspirin daily to be taken with food to minimize GI upset. Duration of treatment is indefinitely In terms of future follow-up, he does not need to return

## 2023-02-26 NOTE — Progress Notes (Signed)
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  Aspirin for Preventing the Recurrence of Venous Thromboembolism Benay Pike, M.D., Ph.D., Chrisandra Netters, M.D., Lonia Farber, M.D., Vallery Ridge, M.D., Milas Hock, M.D., Yetta Flock, M.D., Veronda Prude, M.D., Roni Bread, M.D., Cardell Peach, M.D., Consuello Bossier, M.D., Charleston Ropes, M.D., and Ileana Roup, M.D., Ph.D., for the [facility] Investigators* Malva Limes Med 2012; 366:1959-1967May 24, 2012DOI: 10.1056/[id number]  The aim of the Aspirin for the Prevention of Recurrent Venous Thromboembolism (the Warfarin and Aspirin [WARFASA]) study was to assess the clinical benefit of aspirin for the prevention of recurrence after a course of treatment with vitamin K antagonists in patients with unprovoked venous thromboembolism.  WARFASA was a multicenter, investigator-initiated, randomized, double-blind clinical trial. Eligible patients were randomly assigned to aspirin, 100 mg once daily, or placebo for 2 years, with the option of extending the study treatment. Randomization occurred within 2 weeks after vitamin K antagonists had been withdrawn.  Venous thromboembolism recurred in 28 of the 205 patients who received aspirin and in 43 of the 197 patients who received placebo (6.6% vs. 11.2% per year; hazard ratio, 0.58; 95% confidence interval [CI], 0.36 to 0.93) (median study period, 24.6 months).  During a median treatment period of 23.9 months, 23 patients taking aspirin and 39 taking placebo had a recurrence (5.9% vs. 11.0% per year; hazard ratio, 0.55; 95% CI, 0.33 to 0.92). One patient in each treatment group had a major bleeding episode. Adverse events were similar in the two groups.  Aspirin reduced the risk of recurrence when given to patients with unprovoked venous thromboembolism who had discontinued anticoagulant treatment, with no apparent increase in the risk of major bleeding  As aspirin is not available in 100 mg dose I would recommend him to take 81 mg aspirin daily to be taken with food to minimize GI upset. Duration of treatment is indefinitely In terms of future follow-up, he does not need to return  No orders of the defined types were placed in this encounter.   All questions were answered. The patient knows to call the clinic with any problems, questions or concerns. The total time spent in the appointment was 30 minutes encounter with patients including review of chart and various tests results, discussions about plan of care and coordination of care plan   Artis Delay, MD 02/26/2023 12:02 PM  INTERVAL HISTORY: Please see below for problem oriented charting. he returns for treatment follow-up and further discussion regarding recent elevation of D-dimer, leading to repeat ultrasound venous Doppler that come back negative for DVT We spent a lot of time discussing the limitation of D-dimer test We discussed the risk and benefits of screening for thrombophilia disorder We reviewed the risk and benefits of aspirin therapy as secondary prevention  REVIEW OF SYSTEMS:   Constitutional: Denies fevers, chills or abnormal weight loss Eyes: Denies blurriness of vision Ears, nose, mouth, throat, and face: Denies mucositis or sore throat Respiratory: Denies cough, dyspnea or wheezes Cardiovascular: Denies palpitation, chest discomfort or lower extremity  swelling Gastrointestinal:  Denies nausea, heartburn or change in bowel habits Skin: Denies abnormal skin rashes Lymphatics: Denies new lymphadenopathy or easy bruising Neurological:Denies numbness, tingling or new weaknesses Behavioral/Psych: Mood is stable, no new changes  All other systems were reviewed with the patient and are negative.  I have reviewed the past medical history, past surgical history, social history and family history with the patient and they are unchanged from previous note.  ALLERGIES:  is allergic to levaquin [levofloxacin in d5w] and paroxetine hcl.  MEDICATIONS:  Current Outpatient Medications  Medication Sig Dispense Refill   aspirin EC 81 MG tablet Take 81 mg by mouth daily. Swallow whole.     cholecalciferol (VITAMIN D3) 25 MCG (1000 UNIT) tablet Take 1,000 Units by mouth daily.     glucosamine-chondroitin 500-400 MG tablet Take 1 tablet by mouth daily. Plus vitamin D     KRILL OIL PO Take 1 capsule by mouth daily.     cabergoline (DOSTINEX) 0.5 MG tablet Take 0.25 mg by mouth once a week.     FLUoxetine (PROZAC) 20 MG capsule Take 20 mg by mouth daily.     Multiple Vitamins-Minerals (LUTEIN-ZEAXANTHIN) TABS 6 mg daily.     Naftifine HCl (NAFTIN) 2 % GEL Apply topically.     Omega-3 Fatty Acids (FISH OIL) 1200 MG CPDR 1000-2000 daily     No current facility-administered medications for this visit.    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  This is approximately in the year of 2014 When I reviewed the sports medicine visit note in 2015, he was noted to have discomfort/tennis elbow diagnosis on the left upper extremity Most recently, he palpated a  cord on the left basilic vein territory several days ago that developed into acute achiness He underwent ultrasound venous Doppler of the upper extremity on 01/02/2022 which showed  Positive for occlusive thrombus within the left basilic vein from the wrist to the brachial junction. Nonocclusive thrombus extends into the brachial vein.  He was started on anticoagulation therapy He denies bleeding complications so far He denies recent chest pain on exertion, shortness of breath on minimal exertion, pre-syncopal episodes, hemoptysis, or palpitation. He denies recent history of trauma, long distance travel, dehydration, recent surgery, smoking or prolonged immobilization. On review of his oral fluid intake, he drinks approximately 40 to 50 ounces of water per day He had no prior history or diagnosis of cancer. His age appropriate screening programs are up-to-date. He had prior surgeries before and never had perioperative thromboembolic events. The patient is not on testosterone replacement therapy and never had thrombotic events. There is no family history of blood clots or miscarriages. Patient has completed treatment with anticoagulation therapy He was noted to have elevated D-dimer recently and had repeat ultrasound venous Doppler April 2024 that come back negative for DVT  PHYSICAL EXAMINATION: ECOG PERFORMANCE STATUS: 0 - Asymptomatic  Vitals:   02/26/23 1058  BP: 122/71  Pulse: 63  Resp: 18  Temp: 97.7 F (36.5 C)  SpO2: 100%   Filed Weights   02/26/23 1058  Weight: 163 lb (73.9 kg)    GENERAL:alert, no distress and comfortable  NEURO: alert & oriented x 3 with fluent speech, no focal  motor/sensory deficits  LABORATORY DATA:  I have reviewed the data as listed    Component Value Date/Time   NA 140 03/29/2022 0819   K 4.3 03/29/2022 0819   CL 104 03/29/2022 0819   CO2 32 03/29/2022 0819   GLUCOSE 85 03/29/2022 0819   BUN 24 (H) 03/29/2022 0819   CREATININE 0.86  03/29/2022 0819   CALCIUM 9.8 03/29/2022 0819   GFRNONAA >60 03/29/2022 0819   GFRAA >90 07/23/2013 0433    No results found for: "SPEP", "UPEP"  Lab Results  Component Value Date   WBC 4.8 03/29/2022   NEUTROABS 2.3 03/29/2022   HGB 14.8 03/29/2022   HCT 42.8 03/29/2022   MCV 95.1 03/29/2022   PLT 190 03/29/2022      Chemistry      Component Value Date/Time   NA 140 03/29/2022 0819   K 4.3 03/29/2022 0819   CL 104 03/29/2022 0819   CO2 32 03/29/2022 0819   BUN 24 (H) 03/29/2022 0819   CREATININE 0.86 03/29/2022 0819      Component Value Date/Time   CALCIUM 9.8 03/29/2022 0819       RADIOGRAPHIC STUDIES: I have personally reviewed the radiological images as listed and agreed with the findings in the report. VAS [place] UPPER EXTREMITY VENOUS DUPLEX  Result Date: 02/13/2023 UPPER VENOUS STUDY  Patient Name:  [name]  Date of Exam:   02/13/2023 Medical Rec #: [id number]           Accession #:    [id number] Date of Birth: 18-Mar-1951           Patient Gender: M Patient Age:   72 years Exam Location:  [facility] Procedure:      VAS [place] UPPER EXTREMITY VENOUS DUPLEX Referring Phys: Jerelyn Scott HUSAIN --------------------------------------------------------------------------------  Indications: Pain, and Swelling Comparison Study: Previous exam on 01/02/22 was positive for partial thrombus of                   brachial vein and SVT in basilic vein in forearm. Performing Technologist: Ernestene Mention RVT, RDMS  Examination Guidelines: A complete evaluation includes B-mode imaging, spectral Doppler, color Doppler, and power Doppler as needed of all accessible portions of each vessel. Bilateral testing is considered an integral part of a complete examination. Limited examinations for reoccurring indications may be performed as noted.  Right Findings: +----------+------------+---------+-----------+----------+-------+ RIGHT     CompressiblePhasicitySpontaneousPropertiesSummary  +----------+------------+---------+-----------+----------+-------+ Subclavian    Full       Yes       Yes                      +----------+------------+---------+-----------+----------+-------+  Left Findings: +----------+------------+---------+-----------+----------+-------+ LEFT      CompressiblePhasicitySpontaneousPropertiesSummary +----------+------------+---------+-----------+----------+-------+ IJV           Full       Yes       Yes                      +----------+------------+---------+-----------+----------+-------+ Subclavian    Full       Yes       Yes                      +----------+------------+---------+-----------+----------+-------+ Axillary      Full       Yes       Yes                      +----------+------------+---------+-----------+----------+-------+  Brachial      Full       Yes       Yes                      +----------+------------+---------+-----------+----------+-------+ Radial        Full                                          +----------+------------+---------+-----------+----------+-------+ Ulnar         Full                                          +----------+------------+---------+-----------+----------+-------+ Cephalic      Full                                          +----------+------------+---------+-----------+----------+-------+ Basilic       Full       Yes       Yes                      +----------+------------+---------+-----------+----------+-------+  Summary:  Right: No evidence of thrombosis in the subclavian.  Left: No evidence of deep vein thrombosis in the upper extremity. No evidence of superficial vein thrombosis in the upper extremity.  *See table(s) above for measurements and observations.  Diagnosing physician: Heath Lark Electronically signed by Heath Lark on 02/13/2023 at 9:59:08 PM.    Final

## 2023-03-13 DIAGNOSIS — Z0100 Encounter for examination of eyes and vision without abnormal findings: Secondary | ICD-10-CM | POA: Diagnosis not present

## 2023-03-19 DIAGNOSIS — L820 Inflamed seborrheic keratosis: Secondary | ICD-10-CM | POA: Diagnosis not present

## 2023-03-19 DIAGNOSIS — Z1283 Encounter for screening for malignant neoplasm of skin: Secondary | ICD-10-CM | POA: Diagnosis not present

## 2023-03-19 DIAGNOSIS — D485 Neoplasm of uncertain behavior of skin: Secondary | ICD-10-CM | POA: Diagnosis not present

## 2023-03-19 DIAGNOSIS — D225 Melanocytic nevi of trunk: Secondary | ICD-10-CM | POA: Diagnosis not present

## 2023-03-26 DIAGNOSIS — Z0100 Encounter for examination of eyes and vision without abnormal findings: Secondary | ICD-10-CM | POA: Diagnosis not present

## 2023-04-10 DIAGNOSIS — R69 Illness, unspecified: Secondary | ICD-10-CM | POA: Diagnosis not present

## 2023-05-30 DIAGNOSIS — Z809 Family history of malignant neoplasm, unspecified: Secondary | ICD-10-CM | POA: Diagnosis not present

## 2023-05-30 DIAGNOSIS — L309 Dermatitis, unspecified: Secondary | ICD-10-CM | POA: Diagnosis not present

## 2023-05-30 DIAGNOSIS — R2681 Unsteadiness on feet: Secondary | ICD-10-CM | POA: Diagnosis not present

## 2023-05-30 DIAGNOSIS — M1990 Unspecified osteoarthritis, unspecified site: Secondary | ICD-10-CM | POA: Diagnosis not present

## 2023-05-30 DIAGNOSIS — F324 Major depressive disorder, single episode, in partial remission: Secondary | ICD-10-CM | POA: Diagnosis not present

## 2023-05-30 DIAGNOSIS — Z9181 History of falling: Secondary | ICD-10-CM | POA: Diagnosis not present

## 2023-05-30 DIAGNOSIS — M545 Low back pain, unspecified: Secondary | ICD-10-CM | POA: Diagnosis not present

## 2023-05-30 DIAGNOSIS — E785 Hyperlipidemia, unspecified: Secondary | ICD-10-CM | POA: Diagnosis not present

## 2023-05-30 DIAGNOSIS — G250 Essential tremor: Secondary | ICD-10-CM | POA: Diagnosis not present

## 2023-05-30 DIAGNOSIS — D352 Benign neoplasm of pituitary gland: Secondary | ICD-10-CM | POA: Diagnosis not present

## 2023-06-07 DIAGNOSIS — R5383 Other fatigue: Secondary | ICD-10-CM | POA: Diagnosis not present

## 2023-06-07 DIAGNOSIS — Z87442 Personal history of urinary calculi: Secondary | ICD-10-CM | POA: Diagnosis not present

## 2023-06-07 DIAGNOSIS — D352 Benign neoplasm of pituitary gland: Secondary | ICD-10-CM | POA: Diagnosis not present

## 2023-06-07 DIAGNOSIS — D497 Neoplasm of unspecified behavior of endocrine glands and other parts of nervous system: Secondary | ICD-10-CM | POA: Diagnosis not present

## 2023-06-07 DIAGNOSIS — Z9181 History of falling: Secondary | ICD-10-CM | POA: Diagnosis not present

## 2023-06-07 DIAGNOSIS — E162 Hypoglycemia, unspecified: Secondary | ICD-10-CM | POA: Diagnosis not present

## 2023-07-03 DIAGNOSIS — H9012 Conductive hearing loss, unilateral, left ear, with unrestricted hearing on the contralateral side: Secondary | ICD-10-CM | POA: Diagnosis not present

## 2023-07-03 DIAGNOSIS — H6123 Impacted cerumen, bilateral: Secondary | ICD-10-CM | POA: Diagnosis not present

## 2023-07-11 DIAGNOSIS — D352 Benign neoplasm of pituitary gland: Secondary | ICD-10-CM | POA: Diagnosis not present

## 2023-07-12 ENCOUNTER — Ambulatory Visit: Payer: Medicare HMO | Admitting: Family Medicine

## 2023-07-12 ENCOUNTER — Ambulatory Visit
Admission: RE | Admit: 2023-07-12 | Discharge: 2023-07-12 | Disposition: A | Discharge status: Home / Self Care | Payer: Medicare HMO | Source: Ambulatory Visit | Attending: Family Medicine | Admitting: Family Medicine

## 2023-07-12 VITALS — BP 122/78 | Ht 70.0 in | Wt 160.0 lb

## 2023-07-12 DIAGNOSIS — M79644 Pain in right finger(s): Secondary | ICD-10-CM | POA: Diagnosis not present

## 2023-07-12 DIAGNOSIS — G8929 Other chronic pain: Secondary | ICD-10-CM

## 2023-07-12 DIAGNOSIS — M79645 Pain in left finger(s): Secondary | ICD-10-CM

## 2023-07-13 DIAGNOSIS — G8929 Other chronic pain: Secondary | ICD-10-CM | POA: Insufficient documentation

## 2023-07-13 NOTE — Progress Notes (Signed)
  [name] - 72 y.o. male MRN [id number]  Date of birth: 1951-09-19    SUBJECTIVE:      Chief Complaint:/ HPI:  Chronic foot recently worsening left thumb pain.  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  He has multiple areas of osteoarthritis in his body and has had spine surgery previously for this.  Right-hand-dominant.    OBJECTIVE: BP 122/78   Ht 5\' 10"  (1.778 m)   Wt 160 lb (72.6 kg)   BMI 22.96 kg/m   Physical Exam:  Vital signs are reviewed. GENERAL: Well-developed male no acute distress hands: Bilaterally there is no thenar or hypothenar atrophy.  He has full range of motion bilateral thumbs with intact strength.  He has some pain with left thumb abduction and apposition.  The CMP and CMC joints are nontender to palpation.  He has normal wrist flexion and extension.  Normal grip strength.  Normal sensation to soft touch although this is somewhat decreased in the thumb and first finger on the left hand compared with the right.  Subtle difference.  ASSESSMENT & PLAN:  See problem based charting & AVS for pt instructions. Chronic pain of left thumb Left IP joint thumb pain chronic.  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  Will get x-rays bilateral thumbs as he also has some other OA type issues.  Discussed options.  Would like to start with occupational therapy.  Would be open and willing to do injection therapy.  Follow-up 1 month and we will see where he is in relation to improvement.

## 2023-07-13 NOTE — Assessment & Plan Note (Signed)
Left IP joint thumb pain chronic.  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  Will get x-rays bilateral thumbs as he also has some other OA type issues.  Discussed options.  Would like to start with occupational therapy.  Would be open and willing to do injection therapy.  Follow-up 1 month and we will see where he is in relation to improvement.

## 2023-07-19 DIAGNOSIS — M25642 Stiffness of left hand, not elsewhere classified: Secondary | ICD-10-CM | POA: Diagnosis not present

## 2023-07-19 DIAGNOSIS — M25512 Pain in left shoulder: Secondary | ICD-10-CM | POA: Diagnosis not present

## 2023-07-19 DIAGNOSIS — M6281 Muscle weakness (generalized): Secondary | ICD-10-CM | POA: Diagnosis not present

## 2023-07-19 DIAGNOSIS — M25612 Stiffness of left shoulder, not elsewhere classified: Secondary | ICD-10-CM | POA: Diagnosis not present

## 2023-07-19 DIAGNOSIS — M25542 Pain in joints of left hand: Secondary | ICD-10-CM | POA: Diagnosis not present

## 2023-07-23 ENCOUNTER — Encounter: Payer: Self-pay | Admitting: Family Medicine

## 2023-07-23 DIAGNOSIS — M25512 Pain in left shoulder: Secondary | ICD-10-CM | POA: Diagnosis not present

## 2023-07-23 DIAGNOSIS — M25542 Pain in joints of left hand: Secondary | ICD-10-CM | POA: Diagnosis not present

## 2023-07-23 DIAGNOSIS — M25612 Stiffness of left shoulder, not elsewhere classified: Secondary | ICD-10-CM | POA: Diagnosis not present

## 2023-07-23 DIAGNOSIS — M6281 Muscle weakness (generalized): Secondary | ICD-10-CM | POA: Diagnosis not present

## 2023-07-23 DIAGNOSIS — M25642 Stiffness of left hand, not elsewhere classified: Secondary | ICD-10-CM | POA: Diagnosis not present

## 2023-07-26 DIAGNOSIS — M25642 Stiffness of left hand, not elsewhere classified: Secondary | ICD-10-CM | POA: Diagnosis not present

## 2023-07-26 DIAGNOSIS — M25542 Pain in joints of left hand: Secondary | ICD-10-CM | POA: Diagnosis not present

## 2023-07-26 DIAGNOSIS — M25612 Stiffness of left shoulder, not elsewhere classified: Secondary | ICD-10-CM | POA: Diagnosis not present

## 2023-07-26 DIAGNOSIS — M6281 Muscle weakness (generalized): Secondary | ICD-10-CM | POA: Diagnosis not present

## 2023-07-26 DIAGNOSIS — M25512 Pain in left shoulder: Secondary | ICD-10-CM | POA: Diagnosis not present

## 2023-07-30 DIAGNOSIS — M6281 Muscle weakness (generalized): Secondary | ICD-10-CM | POA: Diagnosis not present

## 2023-07-30 DIAGNOSIS — M25512 Pain in left shoulder: Secondary | ICD-10-CM | POA: Diagnosis not present

## 2023-07-30 DIAGNOSIS — M25612 Stiffness of left shoulder, not elsewhere classified: Secondary | ICD-10-CM | POA: Diagnosis not present

## 2023-07-30 DIAGNOSIS — M25642 Stiffness of left hand, not elsewhere classified: Secondary | ICD-10-CM | POA: Diagnosis not present

## 2023-07-30 DIAGNOSIS — M25542 Pain in joints of left hand: Secondary | ICD-10-CM | POA: Diagnosis not present

## 2023-08-13 ENCOUNTER — Ambulatory Visit: Payer: Medicare HMO | Admitting: Sports Medicine

## 2023-08-13 VITALS — BP 112/78 | Ht 70.0 in | Wt 160.0 lb

## 2023-08-13 DIAGNOSIS — M79645 Pain in left finger(s): Secondary | ICD-10-CM | POA: Diagnosis not present

## 2023-08-13 DIAGNOSIS — M19072 Primary osteoarthritis, left ankle and foot: Secondary | ICD-10-CM

## 2023-08-13 DIAGNOSIS — G8929 Other chronic pain: Secondary | ICD-10-CM | POA: Diagnosis not present

## 2023-08-13 NOTE — Assessment & Plan Note (Signed)
His foot malalignment is significant I think we need to go ahead and get him in a custom orthotic that causes significant block of pronation at his forefoot midfoot and rear foot on the left and good orthotic support of the long arch on the right  He will schedule for custom orthotics

## 2023-08-13 NOTE — Assessment & Plan Note (Addendum)
This is responding to some of a taping measures and changes suggested by occupational therapy.  Continue to do those but also start using some topical Voltaren for inflammatory change  Related to his thumb and upper extremity issues I did suggest some exercises to improve his posture and shoulder positioning along with scapular strengthening

## 2023-08-13 NOTE — Progress Notes (Signed)
Chief complaint left foot and ankle, left shoulder rubbing and follow-up of thumbs  Patient is a retired Sport and exercise psychologist.  He loves hiking and does a lot of walking.  Today he is wearing Solomon boots. He is also an avid [place].  This was affecting his left CMC and to a lesser extent MCP with the thumb key.  He also has to position his shoulders to be comfortable when playing and he gets some grinding on his left shoulder.  He has a history of an upper extremity blood clot in his radial side veins that required anticoagulation for 1 year.  He comes for additional advice and he does note that the scaphoid pads have helped his foot to a significant extent although now he is getting nighttime pain after significant hikes.  Physical exam is a thin white male in no acute distress BP 112/78   Ht 5\' 10"  (1.778 m)   Wt 160 lb (72.6 kg)   BMI 22.96 kg/m   Left thumb has protective taping and shows some squaring of the left [facility]  Elder motion is full although he does have some slight crepitation in his left shoulder with full abduction and elevation. What I do note is that he has a head forward position and some shoulder protraction bilaterally.  He has a history of lower cervical spine fusion.  Repetitive abduction and scapular motion causes more protraction  Left foot shows pronation at the forefoot with a midfoot collapse and navicular drop and slight calcaneal valgus.  He has a midfoot dominant foot with a shortened first metatarsal segment but with the second third and fourth metatarsals is longer longer than the first. Right foot shows the same general structural pattern but without the breakdown.  He has some minor changes over the first and fifth MTPs and he does pronate slightly

## 2023-09-23 ENCOUNTER — Ambulatory Visit: Payer: Medicare HMO | Admitting: Family Medicine

## 2023-09-23 ENCOUNTER — Encounter: Payer: Self-pay | Admitting: Family Medicine

## 2023-09-23 VITALS — BP 120/72 | Ht 70.0 in | Wt 160.0 lb

## 2023-09-23 DIAGNOSIS — M79672 Pain in left foot: Secondary | ICD-10-CM

## 2023-09-23 MED ORDER — NITROGLYCERIN 0.2 MG/HR TD PT24: MEDICATED_PATCH | TRANSDERMAL | 1 refills | Status: AC

## 2023-09-23 NOTE — Progress Notes (Signed)
PCP: Georgann Housekeeper, MD  Subjective:   HPI: Patient is a 72 y.o. male here for custom orthotics.  Patient recently seen by Dr. Darrick Penna who recommended he return for custom orthotics. Has been using scaphoid pads in his hiking shoes which have helped with feet especially on the left - here for something more permanent.  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 Swallow whole.     cabergoline (DOSTINEX) 0.5 MG tablet Take 0.25 mg by mouth once a week.     cholecalciferol (VITAMIN D3) 25 MCG (1000 UNIT) tablet Take 1,000 Units by mouth daily.     FLUoxetine (PROZAC) 20 MG capsule Take 20 mg by mouth daily.     glucosamine-chondroitin 500-400 MG tablet Take 1 tablet by mouth daily. Plus vitamin D     KRILL OIL PO Take 1 capsule by mouth daily.     Multiple Vitamins-Minerals (LUTEIN-ZEAXANTHIN) TABS 6 mg daily.     Naftifine HCl (NAFTIN) 2 % GEL Apply topically.     Omega-3 Fatty Acids (FISH OIL) 1200 MG CPDR 1000-2000 daily     No current facility-administered medications on file prior to visit.    Past Surgical History:  Procedure Laterality Date   CATARACT EXTRACTION Bilateral    x2   CERVICAL DISC FUSED  15 YRS AGO   C 6 TO C 8   EXCISION NEUROMA     scalp   LASIX EYE SURGERY Bilateral YRS AGO   NEPHROLITHOTOMY Left 07/22/2013   Procedure: NEPHROLITHOTOMY PERCUTANEOUS;  Surgeon: Milford Cage, MD;  Location: [facility];  Service: Urology;  Laterality: Left;   WRIST GANGLION CYST REMOVED Right 11/13/1967    Allergies   Allergen Reactions   Levaquin [Levofloxacin In D5w] Other (See Comments)    INTENSE ASCHILLES TENON PAIN   Paroxetine Hcl     BP 120/72   Ht 5\' 10"  (1.778 m)   Wt 160 lb (72.6 kg)   BMI 22.96 kg/m      07/10/2021   11:04 AM  [facility]  Frequency of aerobic exercise (# of days/week) 7  Average time in minutes 45  Frequency of strengthening activities (# of days/week) 0        No data to display              Objective:  Physical Exam:  Gen: NAD, comfortable in exam room  Bilateral feet/ankles: Long arch collapse left > right.  Navicular drop on left.  Focal area of swelling middle forefoot on left.  Mild 1st MT insufficiency.  No other gross deformity, swelling, ecchymoses Mild tenderness to palpation TMT joint on left, over bony prominence left dorsal foot. NV intact distally.   Limited MSK u/s left foot: no cortical irregularity, edema overlying cortex, or neovascularity.  More prominent and superficial bone in area of prominence noted on physical exam  suggestive of old healed metatarsal stress fracture. Assessment & Plan:  1. Bilateral foot pain - custom orthotics made today and felt comfortable.  F/u with Dr. Darrick Penna.  Patient was fitted for a : standard, cushioned, semi-rigid orthotic. The orthotic was heated and afterward the patient stood on the orthotic blank positioned on the orthotic stand. The patient was positioned in subtalar neutral position and 10 degrees of ankle dorsiflexion in a weight bearing stance. After completion of molding, a stable base was applied to the orthotic blank. The blank was ground to a stable position for weight bearing. Size:12 fit & run Base:none Posting:none Additional orthotic padding:none

## 2023-09-26 ENCOUNTER — Encounter: Payer: Self-pay | Admitting: Family Medicine

## 2023-10-08 ENCOUNTER — Encounter: Payer: Medicare HMO | Admitting: Sports Medicine

## 2023-10-09 ENCOUNTER — Encounter: Payer: Medicare HMO | Admitting: Sports Medicine

## 2023-10-11 IMAGING — CR DG FOOT COMPLETE 3+V*L*
2 series · 2 of 2 positions shown · non-contrast
Comparison: None Available.

CLINICAL DATA: Chronic left foot pain at distal third and fourth
metatarsals. History of fracture 45 years ago.

EXAM:
LEFT FOOT - COMPLETE 3+ VIEW

[t foot ap left]
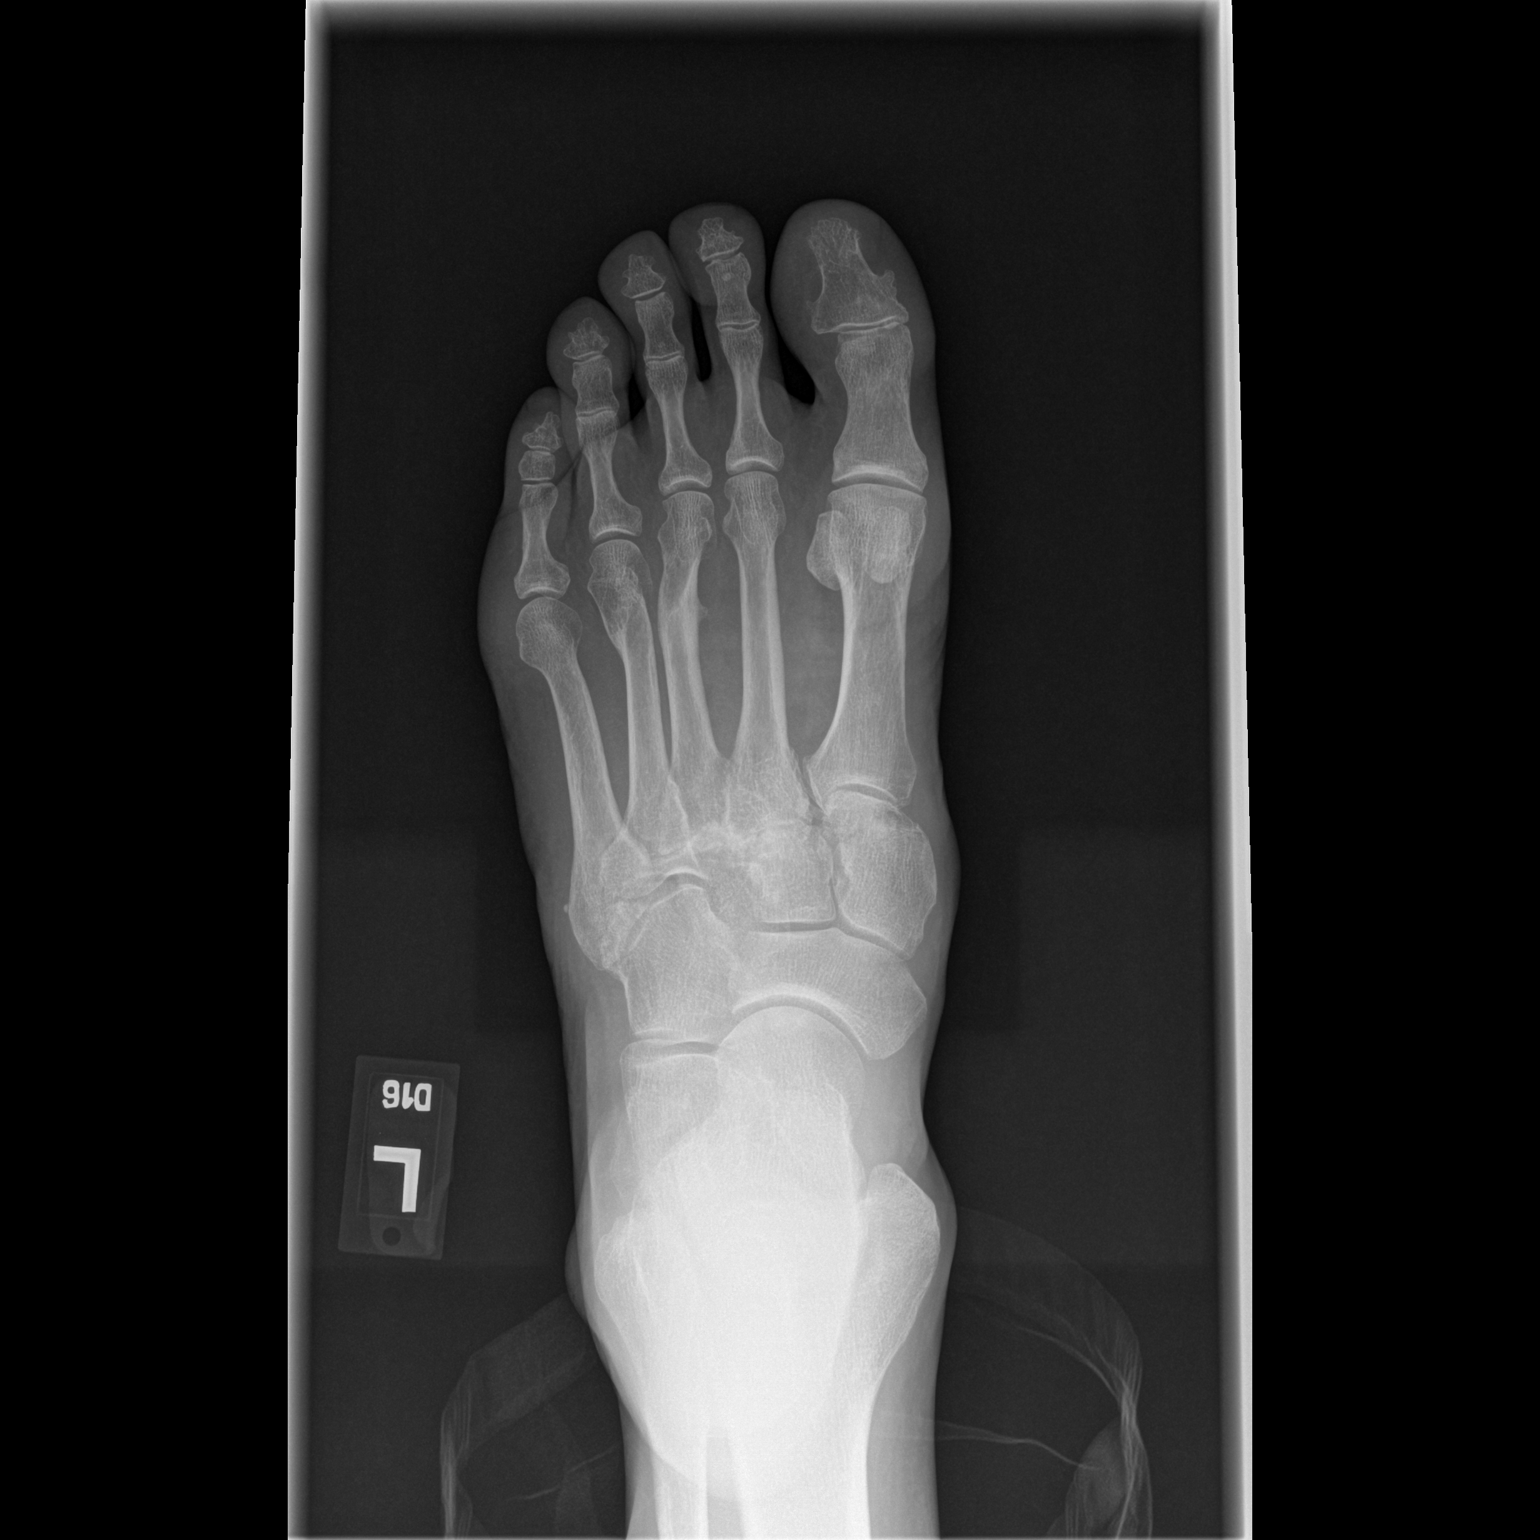

[t foot lat left]
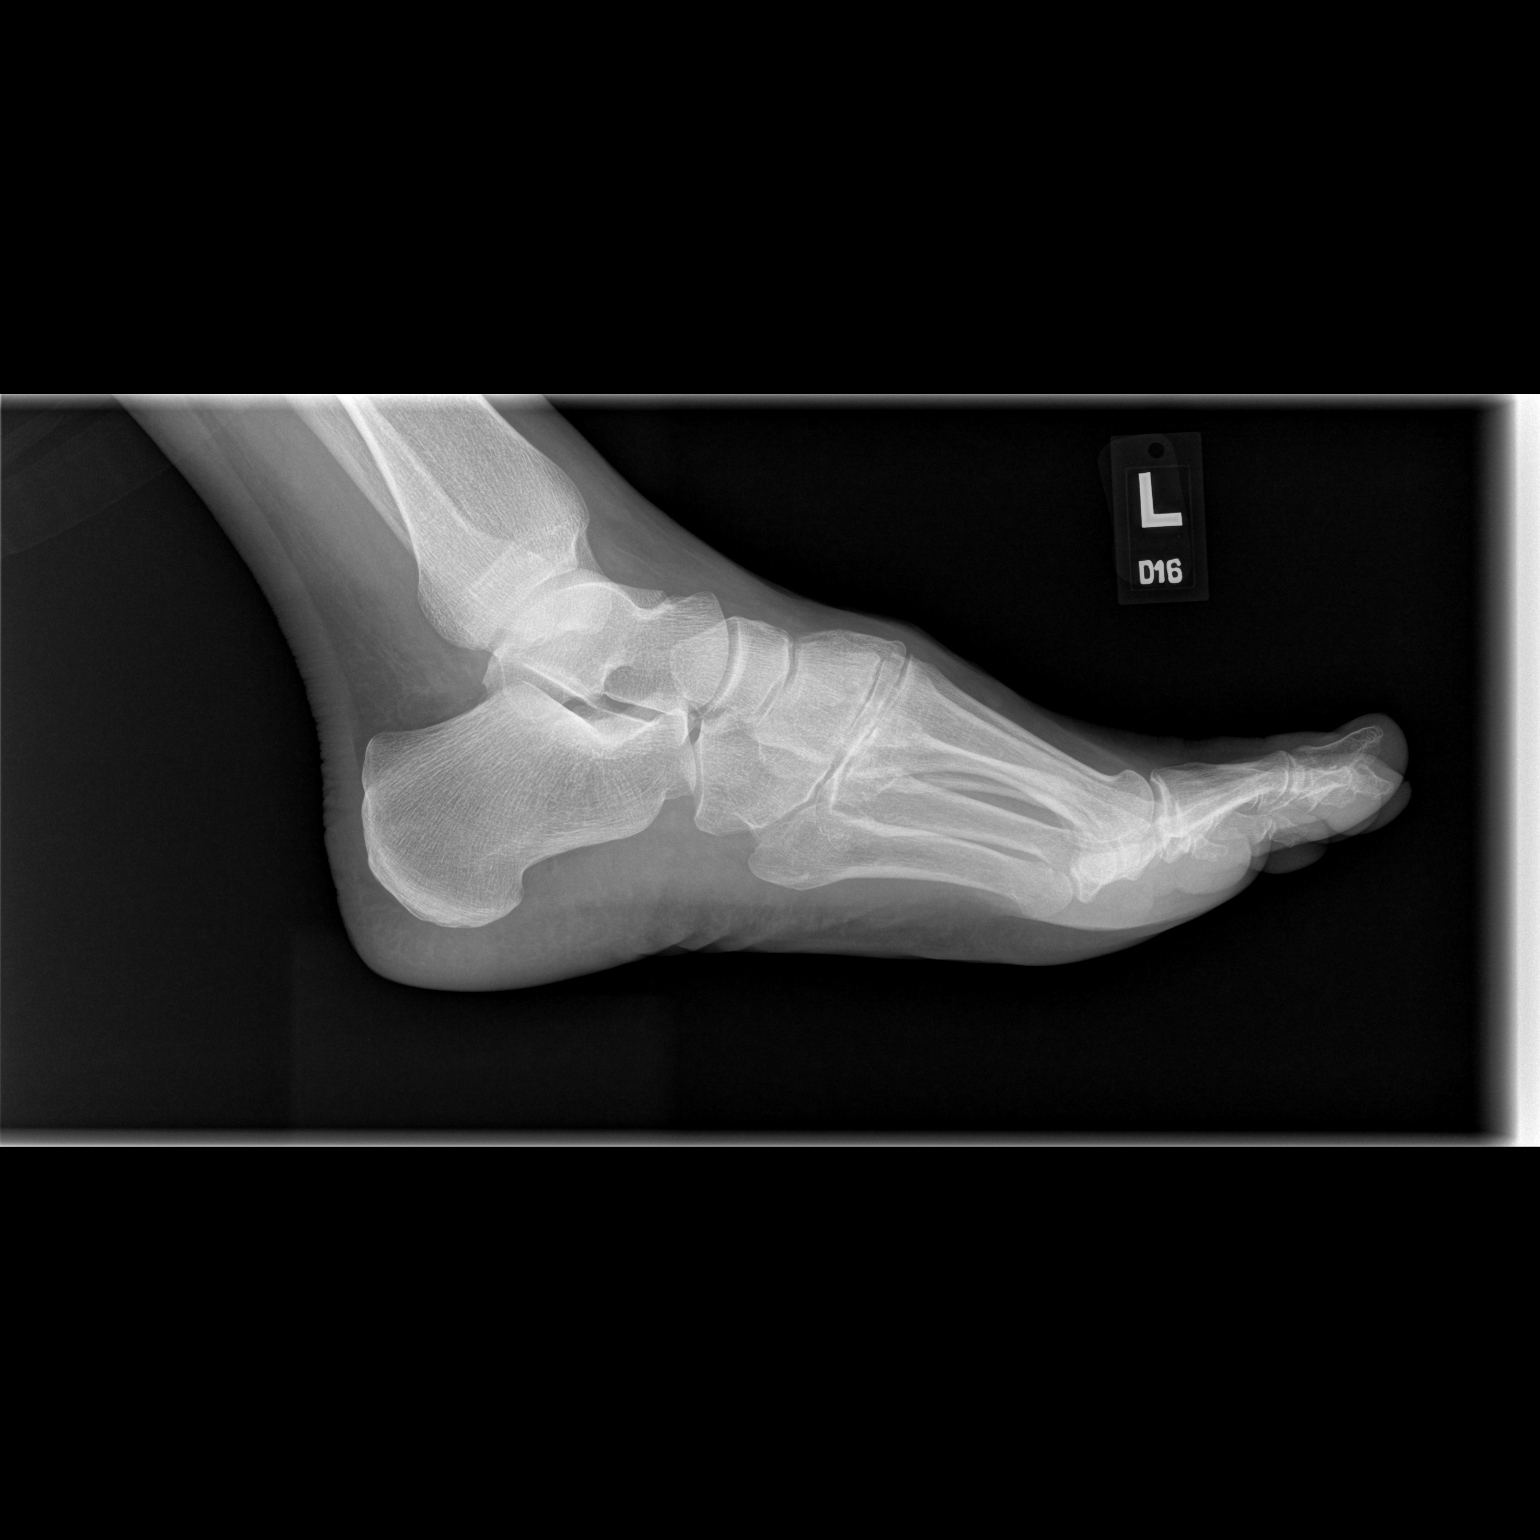

[2 of 2 positions shown; findings below may reference images not displayed]

FINDINGS: Frontal and lateral views of the left foot.

There is a remote healed fracture of the distal shaft of the third
metatarsal with mild chronic cortical thickening and mild lateral
apex angulation. There is similar cortical thickening and angulation
from remote healed fracture of the distal shaft of fourth metatarsal
with minimal medial apex angulation.

Minimal lateral third metatarsal head degenerative spurring at the
metatarsophalangeal joint.

Mild lateral greater than medial great toe metatarsophalangeal joint
space narrowing.

Severe second and moderate third tarsometatarsal joint space
narrowing and subchondral sclerosis best seen on frontal view. More
mild joint space narrowing of the other interphalangeal joints.

No acute fracture is seen.  No dislocation.
IMPRESSION: 1. Remote healed and mildly angulated fractures of the distal shafts
of the third and fourth metatarsals.
2. Severe second and moderate third tarsometatarsal osteoarthritis.

## 2023-11-11 ENCOUNTER — Ambulatory Visit: Payer: Medicare HMO | Admitting: Family Medicine

## 2023-11-11 VITALS — BP 124/82 | Ht 70.0 in | Wt 160.0 lb

## 2023-11-11 DIAGNOSIS — M25512 Pain in left shoulder: Secondary | ICD-10-CM | POA: Diagnosis not present

## 2023-11-11 DIAGNOSIS — M79672 Pain in left foot: Secondary | ICD-10-CM

## 2023-11-12 ENCOUNTER — Encounter: Payer: Self-pay | Admitting: Family Medicine

## 2023-11-13 ENCOUNTER — Encounter: Payer: Self-pay | Admitting: Family Medicine

## 2023-12-17 DIAGNOSIS — D352 Benign neoplasm of pituitary gland: Secondary | ICD-10-CM | POA: Diagnosis not present

## 2023-12-17 DIAGNOSIS — D497 Neoplasm of unspecified behavior of endocrine glands and other parts of nervous system: Secondary | ICD-10-CM | POA: Diagnosis not present

## 2023-12-24 DIAGNOSIS — D352 Benign neoplasm of pituitary gland: Secondary | ICD-10-CM | POA: Diagnosis not present

## 2023-12-24 DIAGNOSIS — D497 Neoplasm of unspecified behavior of endocrine glands and other parts of nervous system: Secondary | ICD-10-CM | POA: Diagnosis not present

## 2024-01-14 DIAGNOSIS — H903 Sensorineural hearing loss, bilateral: Secondary | ICD-10-CM | POA: Diagnosis not present

## 2024-01-17 DIAGNOSIS — D497 Neoplasm of unspecified behavior of endocrine glands and other parts of nervous system: Secondary | ICD-10-CM | POA: Diagnosis not present

## 2024-01-17 DIAGNOSIS — D352 Benign neoplasm of pituitary gland: Secondary | ICD-10-CM | POA: Diagnosis not present

## 2024-01-30 ENCOUNTER — Encounter: Payer: Self-pay | Admitting: Hematology and Oncology

## 2024-02-05 DIAGNOSIS — H52203 Unspecified astigmatism, bilateral: Secondary | ICD-10-CM | POA: Diagnosis not present

## 2024-02-05 DIAGNOSIS — H43813 Vitreous degeneration, bilateral: Secondary | ICD-10-CM | POA: Diagnosis not present

## 2024-02-05 DIAGNOSIS — Z961 Presence of intraocular lens: Secondary | ICD-10-CM | POA: Diagnosis not present

## 2024-03-10 DIAGNOSIS — Z86718 Personal history of other venous thrombosis and embolism: Secondary | ICD-10-CM | POA: Diagnosis not present

## 2024-03-10 DIAGNOSIS — Z125 Encounter for screening for malignant neoplasm of prostate: Secondary | ICD-10-CM | POA: Diagnosis not present

## 2024-03-10 DIAGNOSIS — E7800 Pure hypercholesterolemia, unspecified: Secondary | ICD-10-CM | POA: Diagnosis not present

## 2024-03-10 DIAGNOSIS — E559 Vitamin D deficiency, unspecified: Secondary | ICD-10-CM | POA: Diagnosis not present

## 2024-03-10 DIAGNOSIS — Z0000 Encounter for general adult medical examination without abnormal findings: Secondary | ICD-10-CM | POA: Diagnosis not present

## 2024-04-14 DIAGNOSIS — L820 Inflamed seborrheic keratosis: Secondary | ICD-10-CM | POA: Diagnosis not present

## 2024-04-14 DIAGNOSIS — Z1283 Encounter for screening for malignant neoplasm of skin: Secondary | ICD-10-CM | POA: Diagnosis not present

## 2024-04-14 DIAGNOSIS — D225 Melanocytic nevi of trunk: Secondary | ICD-10-CM | POA: Diagnosis not present

## 2024-09-14 DIAGNOSIS — Z0100 Encounter for examination of eyes and vision without abnormal findings: Secondary | ICD-10-CM | POA: Diagnosis not present
